# Patient Record
Sex: Female | Born: 1960 | Race: White | Hispanic: No | Smoking: Former smoker
Health system: Southern US, Community
[De-identification: ages and names within clinical notes are randomized; demographics above are authoritative.]

## PROBLEM LIST (undated history)

## (undated) ENCOUNTER — Emergency Department (HOSPITAL_COMMUNITY): Discharge status: Absent without leave | Payer: 59

## (undated) DIAGNOSIS — I10 Essential (primary) hypertension: Secondary | ICD-10-CM

## (undated) DIAGNOSIS — E785 Hyperlipidemia, unspecified: Secondary | ICD-10-CM

## (undated) DIAGNOSIS — M1990 Unspecified osteoarthritis, unspecified site: Secondary | ICD-10-CM

## (undated) DIAGNOSIS — K219 Gastro-esophageal reflux disease without esophagitis: Secondary | ICD-10-CM

## (undated) DIAGNOSIS — I700 Atherosclerosis of aorta: Secondary | ICD-10-CM

## (undated) DIAGNOSIS — F419 Anxiety disorder, unspecified: Secondary | ICD-10-CM

## (undated) DIAGNOSIS — F329 Major depressive disorder, single episode, unspecified: Secondary | ICD-10-CM

## (undated) DIAGNOSIS — F32A Depression, unspecified: Secondary | ICD-10-CM

## (undated) HISTORY — DX: Unspecified osteoarthritis, unspecified site: M19.90

## (undated) HISTORY — PX: TUBAL LIGATION: SHX77

## (undated) HISTORY — PX: ABDOMINAL HYSTERECTOMY: SHX81

## (undated) HISTORY — DX: Essential (primary) hypertension: I10

## (undated) HISTORY — DX: Hyperlipidemia, unspecified: E78.5

## (undated) HISTORY — DX: Major depressive disorder, single episode, unspecified: F32.9

## (undated) HISTORY — DX: Depression, unspecified: F32.A

## (undated) HISTORY — DX: Anxiety disorder, unspecified: F41.9

## (undated) HISTORY — DX: Gastro-esophageal reflux disease without esophagitis: K21.9

## (undated) HISTORY — DX: Atherosclerosis of aorta: I70.0

---

## 2007-06-15 ENCOUNTER — Emergency Department: Payer: Self-pay | Admitting: Emergency Medicine

## 2012-02-08 ENCOUNTER — Ambulatory Visit: Payer: Self-pay | Admitting: Internal Medicine

## 2012-05-16 DIAGNOSIS — Z8619 Personal history of other infectious and parasitic diseases: Secondary | ICD-10-CM | POA: Insufficient documentation

## 2012-05-16 DIAGNOSIS — I10 Essential (primary) hypertension: Secondary | ICD-10-CM | POA: Insufficient documentation

## 2012-05-16 HISTORY — DX: Essential (primary) hypertension: I10

## 2012-11-30 DIAGNOSIS — E782 Mixed hyperlipidemia: Secondary | ICD-10-CM | POA: Insufficient documentation

## 2013-01-05 ENCOUNTER — Ambulatory Visit: Payer: Self-pay | Admitting: Gastroenterology

## 2013-05-01 ENCOUNTER — Ambulatory Visit: Payer: Self-pay | Admitting: Family Medicine

## 2013-05-19 DIAGNOSIS — D1803 Hemangioma of intra-abdominal structures: Secondary | ICD-10-CM | POA: Insufficient documentation

## 2013-06-01 DIAGNOSIS — K580 Irritable bowel syndrome with diarrhea: Secondary | ICD-10-CM | POA: Insufficient documentation

## 2013-06-01 DIAGNOSIS — H9193 Unspecified hearing loss, bilateral: Secondary | ICD-10-CM | POA: Insufficient documentation

## 2014-07-27 ENCOUNTER — Other Ambulatory Visit: Payer: Self-pay

## 2014-07-27 DIAGNOSIS — F329 Major depressive disorder, single episode, unspecified: Secondary | ICD-10-CM

## 2014-07-27 DIAGNOSIS — J309 Allergic rhinitis, unspecified: Secondary | ICD-10-CM

## 2014-07-27 DIAGNOSIS — R0982 Postnasal drip: Principal | ICD-10-CM

## 2014-07-27 DIAGNOSIS — F32A Depression, unspecified: Secondary | ICD-10-CM

## 2014-07-27 DIAGNOSIS — K219 Gastro-esophageal reflux disease without esophagitis: Secondary | ICD-10-CM

## 2014-07-27 NOTE — Telephone Encounter (Signed)
Received a fax from [facility] requesting a refill of Flonase, Omeprazole 20mg  & Wellbutrin 75mg 

## 2014-07-28 MED ORDER — FLUTICASONE PROPIONATE 50 MCG/ACT NA SUSP: 2.0000 | Freq: Every day | NASAL | Status: DC

## 2014-07-28 MED ORDER — OMEPRAZOLE 20 MG PO CPDR: 20.0000 mg | DELAYED_RELEASE_CAPSULE | Freq: Every day | ORAL | Status: DC

## 2014-07-28 MED ORDER — BUPROPION HCL 75 MG PO TABS: 75.0000 mg | ORAL_TABLET | Freq: Two times a day (BID) | ORAL | Status: DC

## 2014-07-30 ENCOUNTER — Ambulatory Visit (INDEPENDENT_AMBULATORY_CARE_PROVIDER_SITE_OTHER): Payer: 59 | Admitting: Family Medicine

## 2014-07-30 ENCOUNTER — Telehealth: Payer: Self-pay | Admitting: Family Medicine

## 2014-07-30 ENCOUNTER — Encounter: Payer: Self-pay | Admitting: Family Medicine

## 2014-07-30 VITALS — BP 138/86 | HR 94 | Temp 98.7°F | Resp 16 | Ht 63.0 in | Wt 143.4 lb

## 2014-07-30 DIAGNOSIS — E782 Mixed hyperlipidemia: Secondary | ICD-10-CM | POA: Insufficient documentation

## 2014-07-30 DIAGNOSIS — F411 Generalized anxiety disorder: Secondary | ICD-10-CM | POA: Insufficient documentation

## 2014-07-30 DIAGNOSIS — I10 Essential (primary) hypertension: Secondary | ICD-10-CM | POA: Diagnosis not present

## 2014-07-30 DIAGNOSIS — D1803 Hemangioma of intra-abdominal structures: Secondary | ICD-10-CM

## 2014-07-30 DIAGNOSIS — A6000 Herpesviral infection of urogenital system, unspecified: Secondary | ICD-10-CM | POA: Insufficient documentation

## 2014-07-30 DIAGNOSIS — N329 Bladder disorder, unspecified: Secondary | ICD-10-CM

## 2014-07-30 DIAGNOSIS — I2510 Atherosclerotic heart disease of native coronary artery without angina pectoris: Secondary | ICD-10-CM | POA: Insufficient documentation

## 2014-07-30 DIAGNOSIS — Z79890 Hormone replacement therapy (postmenopausal): Secondary | ICD-10-CM

## 2014-07-30 DIAGNOSIS — J309 Allergic rhinitis, unspecified: Secondary | ICD-10-CM | POA: Insufficient documentation

## 2014-07-30 DIAGNOSIS — N951 Menopausal and female climacteric states: Secondary | ICD-10-CM | POA: Insufficient documentation

## 2014-07-30 DIAGNOSIS — Z8619 Personal history of other infectious and parasitic diseases: Secondary | ICD-10-CM | POA: Insufficient documentation

## 2014-07-30 DIAGNOSIS — F32A Depression, unspecified: Secondary | ICD-10-CM | POA: Insufficient documentation

## 2014-07-30 DIAGNOSIS — F329 Major depressive disorder, single episode, unspecified: Secondary | ICD-10-CM | POA: Insufficient documentation

## 2014-07-30 MED ORDER — HYDROCHLOROTHIAZIDE 12.5 MG PO TABS: 12.5000 mg | ORAL_TABLET | Freq: Every day | ORAL | Status: DC

## 2014-07-30 MED ORDER — DIAZEPAM 5 MG PO TABS: 5.0000 mg | ORAL_TABLET | Freq: Two times a day (BID) | ORAL | Status: DC | PRN

## 2014-07-30 NOTE — Telephone Encounter (Signed)
On abstracting patient's chart there is evidence of CT scan done 05/01/13 shows liver hemangioma, f/u MRI with and without contrast in 6 months was recommended. Does she recall ever having this MRI done? If not may I order it now?

## 2014-07-30 NOTE — Progress Notes (Signed)
Name: Olivia Frazier   MRN: [id number]    DOB: 11/03/1960   Date:07/30/2014       Progress Note  Subjective  Chief Complaint  Chief Complaint  Patient presents with  . Hypertension    patient presents with elevated blood pressure for a couple of weeks. Patient thinks it is anxiety related.    HPI  Patient is here for routine follow up of Hypertension. First diagnosed with hypertension several years ago. Current anti-hypertension medication regimen includes dietary modification, weight management and ACE inhibiter Prinvil 20mg  one a day. Patient is following physician recommended management. Checking blood pressure outside of physician office. Results average systolic 790-240 and average diastolic 97-35. Associated symptoms do not include headache, dizziness, nausea, lower extremity swelling, shortness of breath, chest pain, numbness.  Anxiety: Patient complains of anxiety disorder.  She has the following symptoms: difficulty concentrating, fatigue, irritable, racing thoughts. Onset of symptoms was approximately several years ago, gradually worsening since that time. She denies current suicidal and homicidal ideation. Family history significant for anxiety and depression.Possible organic causes contributing are: none. Risk factors: previous episode of depression Previous treatment includes benzodiazepines valium and Wellbutrin and none.  She complains of the following side effects from the treatment: decreased libido when on several SSRIs. Currently on Wellbutrin 75mg  po bid but only taking it once a day. Using valium sparingly.    Past Medical History  Diagnosis Date  . Anxiety   . Arthritis   . Depression   . GERD (gastroesophageal reflux disease)   . Hyperlipidemia   . Hypertension     Past Surgical History  Procedure Laterality Date  . Abdominal hysterectomy    . Tubal ligation      Family History  Problem Relation Age of Onset  . Arthritis Mother   . Cancer Mother      melanoma on her leg  . Hearing loss Mother   . Heart disease Mother     leaky valve  . Hypertension Mother     history of  . Kidney disease Mother   . Vision loss Mother   . Thrombosis Mother     history of in right leg  . Dementia Mother   . Diabetes Father   . Stroke Father   . Hypertension Father   . Cancer Maternal Aunt     breast  . Diverticulitis Maternal Grandmother   . Stroke Maternal Grandmother     had a brain tumor  . Early death Maternal Grandfather   . Stroke Maternal Grandfather     History   Social History  . Marital Status: Divorced    Spouse Name: N/A  . Number of Children: 2  . Years of Education: N/A   Occupational History  . Not on file.   Social History Main Topics  . Smoking status: Former Research scientist (life sciences)  . Smokeless tobacco: Not on file  . Alcohol Use: No  . Drug Use: No  . Sexual Activity:    Partners: Male   Other Topics Concern  . Not on file   Social History Narrative  . No narrative on file     Current outpatient prescriptions:  .  Chromium 500 MCG TABS, Take 1 tablet by mouth daily., Disp: , Rfl:  .  estradiol (ESTRACE) 1 MG tablet, Take 1 tablet by mouth daily., Disp: , Rfl:  .  Multiple Vitamins tablet, Take 1 tablet by mouth daily., Disp: , Rfl:  .  Omega-3 Fatty Acids (SALMON OIL-1000) 200 MG CAPS,  Take 1 capsule by mouth daily., Disp: , Rfl:  .  pravastatin (PRAVACHOL) 80 MG tablet, Take 1 tablet by mouth at bedtime., Disp: , Rfl:  .  valACYclovir (VALTREX) 500 MG tablet, Take 1 tablet by mouth daily., Disp: , Rfl:  .  Ascorbic Acid (VITAMIN C) 1000 MG tablet, Take 1 tablet by mouth daily., Disp: , Rfl:  .  buPROPion (WELLBUTRIN) 75 MG tablet, Take 1 tablet (75 mg total) by mouth 2 (two) times daily., Disp: 60 tablet, Rfl: 5 .  Calcium Carbonate-Vitamin D 600-400 MG-UNIT per tablet, Take 1 tablet by mouth daily., Disp: , Rfl:  .  diazepam (VALIUM) 5 MG tablet, Take 1 tablet (5 mg total) by mouth 2 (two) times daily as needed for  anxiety., Disp: 60 tablet, Rfl: 1 .  fluticasone (FLONASE) 50 MCG/ACT nasal spray, Place 2 sprays into both nostrils daily., Disp: 16 g, Rfl: 5 .  Garlic 5366 MG CAPS, Take 1 capsule by mouth daily., Disp: , Rfl:  .  hydrochlorothiazide (HYDRODIURIL) 12.5 MG tablet, Take 1 tablet (12.5 mg total) by mouth daily., Disp: 30 tablet, Rfl: 3 .  loratadine (CLARITIN) 10 MG tablet, Take 1 tablet by mouth daily., Disp: , Rfl:  .  omeprazole (PRILOSEC) 20 MG capsule, Take 1 capsule (20 mg total) by mouth daily., Disp: 30 capsule, Rfl: 5 .  PRINIVIL 20 MG tablet, Take 1 tablet by mouth daily., Disp: , Rfl:   Allergies  Allergen Reactions  . Escitalopram Oxalate     Other reaction(s): Dizziness  . Omega-3-Acid Ethyl Esters Other (See Comments)    Hematuria, UTI  . Paroxetine Hcl     Other reaction(s): Headache, Other (See Comments) Rapid pulse  . Sertraline Other (See Comments)    Other reaction(s): Other (See Comments) Rapid pulse     ROS  CONSTITUTIONAL: No significant weight changes, fever, chills, weakness or fatigue.  HEENT:  - Eyes: No visual changes.  - Ears: No auditory changes. No pain.  - Nose: No sneezing, congestion, runny nose. - Throat: No sore throat. No changes in swallowing. SKIN: No rash or itching.  CARDIOVASCULAR: No chest pain, chest pressure or chest discomfort. No palpitations or edema.  RESPIRATORY: No shortness of breath, cough or sputum.  GASTROINTESTINAL: No anorexia, nausea, vomiting. No changes in bowel habits. No abdominal pain or blood.  GENITOURINARY: No dysuria. No frequency. No discharge.  NEUROLOGICAL: No headache, dizziness, syncope, paralysis, ataxia, numbness or tingling in the extremities. No memory changes. No change in bowel or bladder control.  MUSCULOSKELETAL: No joint pain. No muscle pain. HEMATOLOGIC: No anemia, bleeding or bruising.  LYMPHATICS: No enlarged lymph nodes.  PSYCHIATRIC: No change in mood. No change in sleep pattern.   ENDOCRINOLOGIC: No reports of sweating, cold or heat intolerance. No polyuria or polydipsia.   Objective  Filed Vitals:   07/30/14 1445  BP: 138/86  Pulse: 94  Temp: 98.7 F (37.1 C)  TempSrc: Oral  Resp: 16  Height: 5\' 3"  (1.6 m)  Weight: 143 lb 6.4 oz (65.046 kg)  SpO2: 96%    Physical Exam  Constitutional: Patient appears well-developed and well-nourished. In no distress.  HEENT:  - Head: Normocephalic and atraumatic.  - Ears: Bilateral TMs gray, no erythema or effusion - Nose: Nasal mucosa moist - Mouth/Throat: Oropharynx is clear and moist. No tonsillar hypertrophy or erythema. No post nasal drainage.  - Eyes: Conjunctivae clear, EOM movements normal. PERRLA. No scleral icterus.  Neck: Normal range of motion. Neck supple. No JVD  present. No thyromegaly present.  Cardiovascular: Normal rate, regular rhythm and normal heart sounds.  No murmur heard.  Pulmonary/Chest: Effort normal and breath sounds normal. No respiratory distress. Musculoskeletal: Normal range of motion bilateral UE and LE, no joint effusions. Peripheral vascular: Bilateral LE no edema. Neurological: CN II-XII grossly intact with no focal deficits. Alert and oriented to person, place, and time. Coordination, balance, strength, speech and gait are normal.  Skin: Skin is warm and dry. No rash noted. No erythema.  Psychiatric: Patient has a normal mood and affect. Behavior is normal in office today. Judgment and thought content normal in office today.   Assessment & Plan  1. Hypertension goal BP (blood pressure) < 150/90 Continue checking BP at home. Continue Prinvil 20mg  one a day. Add on HCTZ as needed.  - hydrochlorothiazide (HYDRODIURIL) 12.5 MG tablet; Take 1 tablet (12.5 mg total) by mouth daily.  Dispense: 30 tablet; Refill: 3  2. GAD (generalized anxiety disorder) Encouraged patient to take her Bupropion twice a day as prescribed instead of once a day. Valium refilled.

## 2014-08-02 NOTE — Telephone Encounter (Signed)
Tried to contact patient to find out if MRI was performed, but there was no answer. A message was left for the patient to give Olivia Frazier a call back when she got the chance.

## 2014-08-03 ENCOUNTER — Other Ambulatory Visit: Payer: Self-pay | Admitting: Family Medicine

## 2014-08-03 DIAGNOSIS — I10 Essential (primary) hypertension: Secondary | ICD-10-CM

## 2014-08-03 NOTE — Telephone Encounter (Signed)
Patient returned my call and stated that she had a bladder tack back in 2001 by Dr. Davis Gourd, but then she had to have it redone since some of the staples moved. Patient stated that not all staples were removed due to it being unsafe. Patient stated that she did not mind having the MRI done but she wanted to make sure that the staples would not come out. She asked if she could have an xray first then the MRI.  Contacted the MRI dept, and spoke to [place]. She stated they scan people all of the time with bladder tacks so she will be fine. Patient and Dr. Nadine Counts was informed.  Patient needs an appt anytime after 1pm on Friday when she gets off of work.

## 2014-08-03 NOTE — Telephone Encounter (Signed)
Please let patient know: MRI liver with and without contrast ordered for later in July, someone should call her within the next 2 weeks to schedule that. X-ray of pelvis ordered to be done at outpatient imaging center [place] at her convenience. Also she will need to have a recent CMP blood work on file for the contrast with her MRI. Lab work ordered and printed out for patient to get done.

## 2014-08-03 NOTE — Telephone Encounter (Signed)
Please let patient know: MRI liver with and without contrast ordered for later in July. X-ray of pelvis ordered to be done at outpatient imaging center [place]. Also she will need to have a recent CMP blood work on file for the contrast with her MRI. Lab work ordered and printed out for patient to get done.

## 2014-08-17 ENCOUNTER — Encounter: Payer: Self-pay | Admitting: Family Medicine

## 2014-08-20 ENCOUNTER — Ambulatory Visit: Payer: 59

## 2014-08-30 ENCOUNTER — Other Ambulatory Visit: Payer: Self-pay | Admitting: Family Medicine

## 2014-08-30 DIAGNOSIS — E782 Mixed hyperlipidemia: Secondary | ICD-10-CM

## 2014-08-30 MED ORDER — GEMFIBROZIL 600 MG PO TABS: 600.0000 mg | ORAL_TABLET | Freq: Two times a day (BID) | ORAL | Status: DC

## 2014-08-30 NOTE — Telephone Encounter (Signed)
Refill request was sent to Dr. Ashany Sundaram for approval and submission.  

## 2014-08-30 NOTE — Telephone Encounter (Signed)
Pt states she would like to renew the RX Gemfibrozil 600 mg twice a day. She has a few left but they have expired.[facility] 90 day supply.

## 2014-09-01 ENCOUNTER — Other Ambulatory Visit: Payer: Self-pay | Admitting: Family Medicine

## 2014-09-01 DIAGNOSIS — K580 Irritable bowel syndrome with diarrhea: Secondary | ICD-10-CM

## 2014-09-01 MED ORDER — LOPERAMIDE HCL 2 MG PO TABS: 2.0000 mg | ORAL_TABLET | Freq: Four times a day (QID) | ORAL | Status: DC | PRN

## 2014-09-01 NOTE — Telephone Encounter (Signed)
Refill request was sent to Dr. Bobetta Lime for approval and submission.  Imodium A-D 2MG , 1 (one) Tablet Tablet start 4mg  po x1 then 2mg  po after each loose stool until maint. dose established, max 16mg /day, #60, 30 days starting 01/15/2014, Ref. x1. Active. Associated Diagnosis:Irritable bowel syndrome with diarrhea (564.1  K58.0)  Recorded 03/05/2014 02:19 PM by Lolita Rieger, Office Visit.

## 2014-09-01 NOTE — Telephone Encounter (Signed)
Requesting refill on Loperamide 2mg  as needed and Gemfibrozil 600mg  2x daily. Please send [facility] fax [phone] (p) [phone] ext [phone]

## 2014-09-03 ENCOUNTER — Encounter (INDEPENDENT_AMBULATORY_CARE_PROVIDER_SITE_OTHER): Payer: Self-pay

## 2014-09-24 ENCOUNTER — Ambulatory Visit (INDEPENDENT_AMBULATORY_CARE_PROVIDER_SITE_OTHER): Payer: 59 | Admitting: Family Medicine

## 2014-09-24 ENCOUNTER — Encounter: Payer: Self-pay | Admitting: Family Medicine

## 2014-09-24 ENCOUNTER — Ambulatory Visit
Admission: RE | Admit: 2014-09-24 | Discharge: 2014-09-24 | Disposition: A | Discharge status: Home / Self Care | Payer: 59 | Source: Ambulatory Visit | Attending: Family Medicine | Admitting: Family Medicine

## 2014-09-24 VITALS — BP 128/74 | HR 97 | Temp 98.4°F | Resp 16 | Ht 63.0 in | Wt 145.7 lb

## 2014-09-24 DIAGNOSIS — R102 Pelvic and perineal pain unspecified side: Secondary | ICD-10-CM

## 2014-09-24 DIAGNOSIS — R1084 Generalized abdominal pain: Secondary | ICD-10-CM

## 2014-09-24 DIAGNOSIS — R071 Chest pain on breathing: Secondary | ICD-10-CM | POA: Diagnosis not present

## 2014-09-24 DIAGNOSIS — N329 Bladder disorder, unspecified: Secondary | ICD-10-CM

## 2014-09-24 NOTE — Progress Notes (Signed)
Name: Olivia Frazier   MRN: [id number]    DOB: Sep 19, 1960   Date:09/24/2014       Progress Note  Subjective  Chief Complaint  Chief Complaint  Patient presents with  . Advice Only    patient wants to talk about the MRI and she needs an x-ray.    HPI  Olivia Frazier is a pleasant 54 year old female who is here today to discuss getting some X-rays done prior to her proceeding forward with MRI of her abdomen to surveillance a previously found Liver Hemangioma.  She is experiencing some lower midline chest wall pain with deep breathing, abdominal bloating, distention, discomfort, pelvic discomfort. [name] is concerned that her previous multiple surgeries in her pelvic area has surgical stables or clips that may cause harm during an MRI. Denies fevers, chills, coughing, nausea, vomiting, change in bowel habits, palpitations, vaginal bleeding. Occasionally does get numbness now her left arm. She has started back on her cholesterol medication.  Patient Active Problem List   Diagnosis Date Noted  . Allergic rhinitis 07/30/2014  . Menopausal syndrome on hormone replacement therapy 07/30/2014  . GAD (generalized anxiety disorder) 07/30/2014  . Combined fat and carbohydrate induced hyperlipemia 07/30/2014  . Coronary atherosclerosis of native coronary artery 07/30/2014  . History of Salmonella gastroenteritis 07/30/2014  . Genital herpes simplex type 2 07/30/2014  . Depression 07/30/2014  . Bilateral change in hearing 06/01/2013  . Irritable bowel syndrome with diarrhea 06/01/2013  . Hemangioma of liver 05/19/2013  . Hypercholesterolemia with hypertriglyceridemia 11/30/2012  . Hypertension goal BP (blood pressure) < 150/90 05/16/2012    Social History  Substance Use Topics  . Smoking status: Former Research scientist (life sciences)  . Smokeless tobacco: Not on file  . Alcohol Use: No     Current outpatient prescriptions:  .  Ascorbic Acid (VITAMIN C) 1000 MG tablet, Take 1 tablet by mouth daily., Disp: , Rfl:   .  buPROPion (WELLBUTRIN) 75 MG tablet, Take 1 tablet (75 mg total) by mouth 2 (two) times daily., Disp: 60 tablet, Rfl: 5 .  Calcium Carbonate-Vitamin D 600-400 MG-UNIT per tablet, Take 1 tablet by mouth daily., Disp: , Rfl:  .  Chromium 500 MCG TABS, Take 1 tablet by mouth daily., Disp: , Rfl:  .  diazepam (VALIUM) 5 MG tablet, Take 1 tablet (5 mg total) by mouth 2 (two) times daily as needed for anxiety., Disp: 60 tablet, Rfl: 1 .  estradiol (ESTRACE) 1 MG tablet, Take 1 tablet by mouth daily., Disp: , Rfl:  .  fluticasone (FLONASE) 50 MCG/ACT nasal spray, Place 2 sprays into both nostrils daily., Disp: 16 g, Rfl: 5 .  Garlic 0865 MG CAPS, Take 1 capsule by mouth daily., Disp: , Rfl:  .  gemfibrozil (LOPID) 600 MG tablet, Take 1 tablet (600 mg total) by mouth 2 (two) times daily before a meal., Disp: 180 tablet, Rfl: 3 .  hydrochlorothiazide (HYDRODIURIL) 12.5 MG tablet, Take 1 tablet (12.5 mg total) by mouth daily., Disp: 30 tablet, Rfl: 3 .  loperamide (IMODIUM A-D) 2 MG tablet, Take 1 tablet (2 mg total) by mouth 4 (four) times daily as needed for diarrhea or loose stools., Disp: 100 tablet, Rfl: 3 .  loratadine (CLARITIN) 10 MG tablet, Take 1 tablet by mouth daily., Disp: , Rfl:  .  Multiple Vitamins tablet, Take 1 tablet by mouth daily., Disp: , Rfl:  .  Omega-3 Fatty Acids (SALMON OIL-1000) 200 MG CAPS, Take 1 capsule by mouth daily., Disp: , Rfl:  .  omeprazole (PRILOSEC) 20 MG capsule, Take 1 capsule (20 mg total) by mouth daily., Disp: 30 capsule, Rfl: 5 .  pravastatin (PRAVACHOL) 80 MG tablet, Take 1 tablet by mouth at bedtime., Disp: , Rfl:  .  PRINIVIL 20 MG tablet, Take 1 tablet by mouth  daily, Disp: 90 tablet, Rfl: 2 .  valACYclovir (VALTREX) 500 MG tablet, Take 1 tablet by mouth daily., Disp: , Rfl:   Past Surgical History  Procedure Laterality Date  . Abdominal hysterectomy    . Tubal ligation      Family History  Problem Relation Age of Onset  . Arthritis Mother   .  Cancer Mother     melanoma on her leg  . Hearing loss Mother   . Heart disease Mother     leaky valve  . Hypertension Mother     history of  . Kidney disease Mother   . Vision loss Mother   . Thrombosis Mother     history of in right leg  . Dementia Mother   . Diabetes Father   . Stroke Father   . Hypertension Father   . Cancer Maternal Aunt     breast  . Diverticulitis Maternal Grandmother   . Stroke Maternal Grandmother     had a brain tumor  . Early death Maternal Grandfather   . Stroke Maternal Grandfather     Allergies  Allergen Reactions  . Escitalopram Oxalate     Other reaction(s): Dizziness  . Omega-3-Acid Ethyl Esters Other (See Comments)    Hematuria, UTI  . Paroxetine Hcl     Other reaction(s): Headache, Other (See Comments) Rapid pulse  . Sertraline Other (See Comments)    Other reaction(s): Other (See Comments) Rapid pulse     Review of Systems  CONSTITUTIONAL: No significant weight changes, fever, chills, weakness or fatigue.  HEENT:  - Eyes: No visual changes.  - Ears: No auditory changes. No pain.  - Nose: No sneezing, congestion, runny nose. - Throat: No sore throat. No changes in swallowing. SKIN: No rash or itching.  CARDIOVASCULAR: No chest pain, chest pressure or chest discomfort. No palpitations or edema.  RESPIRATORY: No shortness of breath, cough or sputum. Yes lower chest wall pain with breathing deeply in. GASTROINTESTINAL: No anorexia, nausea, vomiting. No changes in bowel habits. Yes abdominal discomfort generalized with bloating. GENITOURINARY: No dysuria. No frequency. No discharge. Yes pelvic pain. NEUROLOGICAL: No headache, dizziness, syncope, paralysis, ataxia, numbness or tingling in the extremities. No memory changes. No change in bowel or bladder control.  MUSCULOSKELETAL: No joint pain. No muscle pain. HEMATOLOGIC: No anemia, bleeding or bruising.  LYMPHATICS: No enlarged lymph nodes.  PSYCHIATRIC: No change in mood. No  change in sleep pattern.  ENDOCRINOLOGIC: No reports of sweating, cold or heat intolerance. No polyuria or polydipsia.     Objective  BP 128/74 mmHg  Pulse 97  Temp(Src) 98.4 F (36.9 C) (Oral)  Resp 16  Ht 5\' 3"  (1.6 m)  Wt 145 lb 11.2 oz (66.089 kg)  BMI 25.82 kg/m2  SpO2 97% Body mass index is 25.82 kg/(m^2).  Physical Exam  Constitutional: Patient appears well-developed and well-nourished. In no distress.  Neck: Normal range of motion. Neck supple. No JVD present. No thyromegaly present.  Cardiovascular: Normal rate, regular rhythm and normal heart sounds.  No murmur heard.  Pulmonary/Chest: Effort normal and breath sounds normal. No respiratory distress. Abdomen: Soft, non tender, mildly distended, normal bowel sounds, no HSM. Musculoskeletal: Normal range of motion bilateral UE  and LE, no joint effusions. Peripheral vascular: Bilateral LE no edema. Neurological: CN II-XII grossly intact with no focal deficits. Alert and oriented to person, place, and time. Coordination, balance, strength, speech and gait are normal.  Skin: Skin is warm and dry. No rash noted. No erythema.  Psychiatric: Patient has a normal mood and affect. Behavior is normal in office today. Judgment and thought content normal in office today.   Assessment & Plan  1. Generalized abdominal pain Will assess with X-ray, clinically stable findings. Reassurance provided.  - DG Abd 2 Views; Future  2. Pain of anterior chest wall with respiration Will assess with X-ray, clinically stable findings. Reassurance provided.  - DG Chest 2 View; Future  3. Pelvic pain in female Will assess with X-ray, clinically stable findings. Reassurance provided.  - DG Pelvis Comp Min 3V; Future

## 2014-09-25 ENCOUNTER — Telehealth: Payer: Self-pay | Admitting: Family Medicine

## 2014-09-25 NOTE — Telephone Encounter (Signed)
Please do the following steps:  1) Step one: call radiology department and ensure that patient's previous surgical coils in her pelvic region (SEE X-RAY PELVIS results) will not be a contraindication for her to proceed with MRI ordered to monitor her liver hemangioma.  If radiology says this is okay then go ahead and schedule her MRI for a Friday AFTERNOON ONLY.   2) Step two: call patient and let her know that the results of her chest, abdomen, pelvic x-rays were normal and showed the surgical coils in her pelvis and communicate MRI date/time if it was safe to proceed with this study.

## 2014-09-27 ENCOUNTER — Telehealth: Payer: Self-pay | Admitting: Family Medicine

## 2014-09-27 ENCOUNTER — Other Ambulatory Visit: Payer: Self-pay | Admitting: Family Medicine

## 2014-09-27 ENCOUNTER — Telehealth: Payer: Self-pay

## 2014-09-27 DIAGNOSIS — E782 Mixed hyperlipidemia: Secondary | ICD-10-CM

## 2014-09-27 NOTE — Telephone Encounter (Signed)
Pt is calling to get clarity on her recent x-ray results before she has her MRI

## 2014-09-27 NOTE — Telephone Encounter (Signed)
Called Centralized scheduling, spoke with Vivien Rota to get this patient scheduled for a MRI of the Liver with and without contrast. Patient was given an appt for this Friday (10/01/14) at 3:45pm. Patient is to arrive at 3:15pm and is to only have a light lunch no later than 12 noon.   Patient was informed and then asked if we could fax her the results to [phone].  The requested information was printed and faxed to the number listed, patient's MRI appt and time was written on it and it was faxed.  Confirmation was received.

## 2014-09-27 NOTE — Telephone Encounter (Signed)
Ordered

## 2014-09-27 NOTE — Telephone Encounter (Signed)
Contacted this patient to inform he that she should get a call from the schedulers clarifying that it is ok to proceed with the MRI on Friday after they speak with the radiologist, but she informed me that someone had just called and told her it was ok to have it performed.  Patient stated that she wants to have an order for a Hepatic Panel to make sure her liver was ok. She stated it could be faxed to her office at [phone]

## 2014-09-28 ENCOUNTER — Telehealth: Payer: Self-pay | Admitting: Family Medicine

## 2014-09-28 LAB — HEPATIC FUNCTION PANEL
ALK PHOS: 56 IU/L (ref 39–117)
ALT: 13 IU/L (ref 0–32)
AST: 18 IU/L (ref 0–40)
Albumin: 4.5 g/dL (ref 3.5–5.5)
BILIRUBIN, DIRECT: 0.07 mg/dL (ref 0.00–0.40)
TOTAL PROTEIN: 6.5 g/dL (ref 6.0–8.5)

## 2014-09-28 NOTE — Telephone Encounter (Signed)
Her insurance will not cover for additional MRIs as there is no medical need for anything other than to repeat looking at her liver. Her Abdominal and Chest X-rays were normal.

## 2014-09-28 NOTE — Telephone Encounter (Signed)
Contacted the patient regarding the message below and encouraged her to wait until we get those results to see if any additional testing was needed.

## 2014-09-28 NOTE — Telephone Encounter (Signed)
Pt states she was here on 09/24/14 and a MRI was ordered for the abdomen (liver) and pt states the upper abdomen and the heart is supposed to be added. She called the scheduling for the MRI and she was told the only thing that was ordered was the liver. Pt would like to get all done at one appt.

## 2014-09-28 NOTE — Telephone Encounter (Signed)
ERRONEOUS ENTRY.

## 2014-09-29 ENCOUNTER — Telehealth: Payer: Self-pay

## 2014-09-29 NOTE — Telephone Encounter (Signed)
Notified Helene Kelp in Pharmacist, community at [facility] that I have received prior authorization for MRI abdomen with and without contrast from [facility] for ICD-10: D18.03 Auth# [id number] Valid 09-29-14 Expires 11-13-14

## 2014-10-01 ENCOUNTER — Ambulatory Visit
Admission: RE | Admit: 2014-10-01 | Discharge: 2014-10-01 | Disposition: A | Discharge status: Home / Self Care | Payer: 59 | Source: Ambulatory Visit | Attending: Family Medicine | Admitting: Family Medicine

## 2014-10-01 DIAGNOSIS — K769 Liver disease, unspecified: Secondary | ICD-10-CM | POA: Diagnosis not present

## 2014-10-01 DIAGNOSIS — Z09 Encounter for follow-up examination after completed treatment for conditions other than malignant neoplasm: Secondary | ICD-10-CM | POA: Diagnosis present

## 2014-10-01 DIAGNOSIS — D1803 Hemangioma of intra-abdominal structures: Secondary | ICD-10-CM

## 2014-10-01 MED ORDER — GADOBENATE DIMEGLUMINE 529 MG/ML IV SOLN
20.0000 mL | Freq: Once | INTRAVENOUS | Status: AC | PRN
  Administered 2014-10-01: 13 mL via INTRAVENOUS

## 2014-10-04 ENCOUNTER — Encounter: Payer: Self-pay | Admitting: Family Medicine

## 2014-10-07 ENCOUNTER — Telehealth: Payer: Self-pay | Admitting: Family Medicine

## 2014-10-07 ENCOUNTER — Other Ambulatory Visit: Payer: Self-pay | Admitting: Family Medicine

## 2014-10-07 DIAGNOSIS — E782 Mixed hyperlipidemia: Secondary | ICD-10-CM

## 2014-10-07 MED ORDER — ICOSAPENT ETHYL 1 G PO CAPS: 2.0000 g | ORAL_CAPSULE | Freq: Two times a day (BID) | ORAL | Status: DC

## 2014-10-07 NOTE — Telephone Encounter (Signed)
PT SAID THAT SHE CAN NOT TAKE GEMFIBROZIF. IT MAKES HER STOMACH REAL  SWOLLEN. SHE HAS TAKEN IT 2 DIFFERENT TIMES.  NEEDS SOMETHING ELSE IN IT'S PLACE.

## 2014-10-07 NOTE — Telephone Encounter (Signed)
Patient was informed and said thanks. 

## 2014-10-07 NOTE — Telephone Encounter (Signed)
Stop Gemfibrozil. I have sent to her local [facility] pharmacy Vascepa generic 1 gram capsules, take 2 twice a day, quantity #40 for a 10 day trial to replace the Gemfibrozil. If she can tolerate this new medication ask her to call and let me know to send it to her mail order pharmacy.

## 2014-11-01 DIAGNOSIS — H903 Sensorineural hearing loss, bilateral: Secondary | ICD-10-CM | POA: Insufficient documentation

## 2014-12-02 ENCOUNTER — Other Ambulatory Visit: Payer: Self-pay | Admitting: Family Medicine

## 2015-01-04 ENCOUNTER — Other Ambulatory Visit: Payer: Self-pay | Admitting: Family Medicine

## 2015-01-07 ENCOUNTER — Ambulatory Visit: Payer: 59 | Admitting: Family Medicine

## 2015-01-27 ENCOUNTER — Encounter: Payer: Self-pay | Admitting: Family Medicine

## 2015-02-03 ENCOUNTER — Other Ambulatory Visit: Payer: Self-pay | Admitting: Family Medicine

## 2015-02-04 ENCOUNTER — Encounter: Payer: Self-pay | Admitting: Family Medicine

## 2015-02-04 ENCOUNTER — Ambulatory Visit (INDEPENDENT_AMBULATORY_CARE_PROVIDER_SITE_OTHER): Payer: 59 | Admitting: Family Medicine

## 2015-02-04 VITALS — BP 118/76 | HR 86 | Temp 98.1°F | Resp 12 | Wt 142.9 lb

## 2015-02-04 DIAGNOSIS — K219 Gastro-esophageal reflux disease without esophagitis: Secondary | ICD-10-CM | POA: Diagnosis not present

## 2015-02-04 DIAGNOSIS — F411 Generalized anxiety disorder: Secondary | ICD-10-CM

## 2015-02-04 DIAGNOSIS — Z79890 Hormone replacement therapy (postmenopausal): Secondary | ICD-10-CM | POA: Diagnosis not present

## 2015-02-04 DIAGNOSIS — E782 Mixed hyperlipidemia: Secondary | ICD-10-CM

## 2015-02-04 DIAGNOSIS — N951 Menopausal and female climacteric states: Secondary | ICD-10-CM | POA: Diagnosis not present

## 2015-02-04 DIAGNOSIS — I10 Essential (primary) hypertension: Secondary | ICD-10-CM

## 2015-02-04 DIAGNOSIS — K580 Irritable bowel syndrome with diarrhea: Secondary | ICD-10-CM | POA: Diagnosis not present

## 2015-02-04 DIAGNOSIS — F419 Anxiety disorder, unspecified: Secondary | ICD-10-CM | POA: Insufficient documentation

## 2015-02-04 MED ORDER — CHROMIUM 500 MCG PO TABS: 1.0000 | ORAL_TABLET | Freq: Every day | ORAL | Status: AC

## 2015-02-04 MED ORDER — PRAVASTATIN SODIUM 80 MG PO TABS: 80.0000 mg | ORAL_TABLET | Freq: Every day | ORAL | Status: AC

## 2015-02-04 MED ORDER — OMEPRAZOLE 20 MG PO CPDR: 20.0000 mg | DELAYED_RELEASE_CAPSULE | Freq: Every day | ORAL | Status: DC

## 2015-02-04 MED ORDER — LOPERAMIDE HCL 2 MG PO TABS: 2.0000 mg | ORAL_TABLET | Freq: Four times a day (QID) | ORAL | Status: AC | PRN

## 2015-02-04 MED ORDER — BUPROPION HCL 75 MG PO TABS: 75.0000 mg | ORAL_TABLET | Freq: Two times a day (BID) | ORAL | Status: DC

## 2015-02-04 MED ORDER — HYDROCHLOROTHIAZIDE 12.5 MG PO TABS: 12.5000 mg | ORAL_TABLET | Freq: Every day | ORAL | Status: DC

## 2015-02-04 MED ORDER — ESTRADIOL 1 MG PO TABS: 1.0000 mg | ORAL_TABLET | Freq: Every day | ORAL | Status: AC

## 2015-02-04 MED ORDER — PRINIVIL 20 MG PO TABS: 20.0000 mg | ORAL_TABLET | Freq: Every day | ORAL | Status: AC

## 2015-02-04 NOTE — Progress Notes (Signed)
Name: Olivia Frazier   MRN: [id number]    DOB: August 10, 1960   Date:02/04/2015       Progress Note  Subjective  Chief Complaint  Chief Complaint  Patient presents with  . Medication Refill    HPI  Olivia Frazier is a 54 year old female here today to request refills of all of her medications. No symptoms or concerns to address.    Past Medical History  Diagnosis Date  . Anxiety   . Arthritis   . Depression   . GERD (gastroesophageal reflux disease)   . Hyperlipidemia   . Hypertension     Patient Active Problem List   Diagnosis Date Noted  . Anxiety 02/04/2015  . Bilateral sensorineural hearing loss 11/01/2014  . Generalized abdominal pain 09/24/2014  . Pain of anterior chest wall with respiration 09/24/2014  . Pelvic pain in female 09/24/2014  . Allergic rhinitis 07/30/2014  . Menopausal syndrome on hormone replacement therapy 07/30/2014  . GAD (generalized anxiety disorder) 07/30/2014  . Hypercholesterolemia with hypertriglyceridemia 07/30/2014  . Coronary atherosclerosis of native coronary artery 07/30/2014  . History of Salmonella gastroenteritis 07/30/2014  . Genital herpes simplex type 2 07/30/2014  . Depression 07/30/2014  . Bilateral change in hearing 06/01/2013  . Irritable bowel syndrome with diarrhea 06/01/2013  . Hemangioma of liver 05/19/2013  . Hypercholesterolemia with hypertriglyceridemia 11/30/2012  . Hypertension goal BP (blood pressure) < 150/90 05/16/2012    Social History  Substance Use Topics  . Smoking status: Former Research scientist (life sciences)  . Smokeless tobacco: Not on file  . Alcohol Use: No     Current outpatient prescriptions:  .  Ascorbic Acid (VITAMIN C) 1000 MG tablet, Take 1 tablet by mouth daily., Disp: , Rfl:  .  buPROPion (WELLBUTRIN) 75 MG tablet, Take 1 tablet by mouth two  times daily, Disp: 180 tablet, Rfl: 1 .  Calcium Carbonate-Vitamin D 600-400 MG-UNIT per tablet, Take 1 tablet by mouth daily., Disp: , Rfl:  .  Chromium 500 MCG TABS,  Take 1 tablet by mouth daily., Disp: , Rfl:  .  diazepam (VALIUM) 5 MG tablet, Take 1 tablet (5 mg total) by mouth 2 (two) times daily as needed for anxiety., Disp: 60 tablet, Rfl: 1 .  estradiol (ESTRACE) 1 MG tablet, Take 1 tablet by mouth  daily, Disp: 90 tablet, Rfl: 3 .  fluticasone (FLONASE) 50 MCG/ACT nasal spray, Use 2 sprays in each  nostril daily, Disp: 48 g, Rfl: 2 .  Garlic 123XX123 MG CAPS, Take 1 capsule by mouth daily., Disp: , Rfl:  .  hydrochlorothiazide (HYDRODIURIL) 12.5 MG tablet, Take 1 tablet (12.5 mg total) by mouth daily., Disp: 30 tablet, Rfl: 3 .  Icosapent Ethyl 1 G CAPS, Take 2 g by mouth 2 (two) times daily., Disp: 40 capsule, Rfl: 0 .  loperamide (IMODIUM A-D) 2 MG tablet, Take 1 tablet (2 mg total) by mouth 4 (four) times daily as needed for diarrhea or loose stools., Disp: 100 tablet, Rfl: 3 .  loratadine (CLARITIN) 10 MG tablet, Take 1 tablet by mouth daily., Disp: , Rfl:  .  Multiple Vitamins tablet, Take 1 tablet by mouth daily., Disp: , Rfl:  .  Omega-3 Fatty Acids (SALMON OIL-1000) 200 MG CAPS, Take 1 capsule by mouth daily., Disp: , Rfl:  .  omeprazole (PRILOSEC) 20 MG capsule, Take 1 capsule (20 mg total) by mouth daily., Disp: 30 capsule, Rfl: 5 .  pravastatin (PRAVACHOL) 80 MG tablet, Take 1 tablet by mouth at  bedtime,  Disp: 90 tablet, Rfl: 3 .  PRINIVIL 20 MG tablet, Take 1 tablet by mouth  daily, Disp: 90 tablet, Rfl: 2 .  valACYclovir (VALTREX) 500 MG tablet, Take 1 tablet by mouth  daily, Disp: 90 tablet, Rfl: 2  Allergies  Allergen Reactions  . Escitalopram Oxalate     Other reaction(s): Dizziness  . Omega-3-Acid Ethyl Esters Other (See Comments)    Hematuria, UTI  . Paroxetine Hcl     Other reaction(s): Headache, Other (See Comments) Rapid pulse  . Sertraline Other (See Comments)    Other reaction(s): Other (See Comments) Rapid pulse    Review of Systems  Negative as mentioned in HPI, otherwise all systems reviewed and are  negative.  Objective  BP 118/76 mmHg  Pulse 86  Temp(Src) 98.1 F (36.7 C) (Oral)  Resp 12  Wt 142 lb 14.4 oz (64.819 kg)  SpO2 97%  Body mass index is 25.32 kg/(m^2).   Physical Exam  Constitutional: Patient appears well-developed and well-nourished. In no distress.  Psychiatric: Patient has a normal mood and affect. Behavior is normal in office today. Judgment and thought content normal in office today.    Assessment & Plan   1. Irritable bowel syndrome with diarrhea Refilled  - loperamide (IMODIUM A-D) 2 MG tablet; Take 1 tablet (2 mg total) by mouth 4 (four) times daily as needed for diarrhea or loose stools.  Dispense: 100 tablet; Refill: 5 - Chromium 500 MCG TABS; Take 1 tablet (500 mcg total) by mouth daily.  Dispense: 90 tablet; Refill: 2  2. Menopausal syndrome on hormone replacement therapy  - estradiol (ESTRACE) 1 MG tablet; Take 1 tablet (1 mg total) by mouth daily.  Dispense: 90 tablet; Refill: 2  3. GAD (generalized anxiety disorder)  - buPROPion (WELLBUTRIN) 75 MG tablet; Take 1 tablet (75 mg total) by mouth 2 (two) times daily.  Dispense: 180 tablet; Refill: 2  4. Hypertension goal BP (blood pressure) < 140/90  - hydrochlorothiazide (HYDRODIURIL) 12.5 MG tablet; Take 1 tablet (12.5 mg total) by mouth daily.  Dispense: 90 tablet; Refill: 2 - PRINIVIL 20 MG tablet; Take 1 tablet (20 mg total) by mouth daily.  Dispense: 90 tablet; Refill: 2  5. GERD without esophagitis  - omeprazole (PRILOSEC) 20 MG capsule; Take 1 capsule (20 mg total) by mouth daily.  Dispense: 90 capsule; Refill: 2  6. Hypercholesterolemia with hypertriglyceridemia  - pravastatin (PRAVACHOL) 80 MG tablet; Take 1 tablet (80 mg total) by mouth daily.  Dispense: 90 tablet; Refill: 2

## 2015-02-11 ENCOUNTER — Ambulatory Visit: Payer: 59 | Admitting: Family Medicine

## 2015-03-04 ENCOUNTER — Ambulatory Visit (INDEPENDENT_AMBULATORY_CARE_PROVIDER_SITE_OTHER): Payer: 59 | Admitting: Family Medicine

## 2015-03-04 ENCOUNTER — Encounter: Payer: Self-pay | Admitting: Family Medicine

## 2015-03-04 VITALS — BP 142/84 | HR 93 | Temp 98.5°F | Resp 16 | Ht 63.0 in | Wt 146.3 lb

## 2015-03-04 DIAGNOSIS — R102 Pelvic and perineal pain: Secondary | ICD-10-CM | POA: Diagnosis not present

## 2015-03-04 DIAGNOSIS — N8110 Cystocele, unspecified: Secondary | ICD-10-CM | POA: Diagnosis not present

## 2015-03-04 NOTE — Progress Notes (Signed)
Name: Olivia Frazier   MRN: [id number]    DOB: 08-31-1960   Date:03/04/2015       Progress Note  Subjective  Chief Complaint  Chief Complaint  Patient presents with  . Pelvic Pain    Woke up early wednesday am with sharp shooting pain and lower abdomen discomfort. Patient states has had 2 surgerys for bladder tacts and thinks bladder might be dropping again.  She states it is even painful to sit down.    HPI  Olivia Frazier is a 55 year old female with complaints of pelvic pain for 3 days now. Sudden onset. Pain scale 4/10, tolerable. Located lower abdomen deep in pelvic area pushing into vaginal vault. Described as a sharp pulling pain. Constant pain. Not associated with dysuria, vaginal discharge or abnormal bleeding or changes in bowel habits or fevers. She has had 2 bladder tact surgeries in the past. Olivia Frazier denies heavy lifting or heavy exercising but has been walking on a treadmill more lately.    Past Medical History  Diagnosis Date  . Anxiety   . Arthritis   . Depression   . GERD (gastroesophageal reflux disease)   . Hyperlipidemia   . Hypertension     Patient Active Problem List   Diagnosis Date Noted  . Anxiety 02/04/2015  . GERD without esophagitis 02/04/2015  . Bilateral sensorineural hearing loss 11/01/2014  . Generalized abdominal pain 09/24/2014  . Pain of anterior chest wall with respiration 09/24/2014  . Pelvic pain in female 09/24/2014  . Allergic rhinitis 07/30/2014  . Menopausal syndrome on hormone replacement therapy 07/30/2014  . GAD (generalized anxiety disorder) 07/30/2014  . Coronary atherosclerosis of native coronary artery 07/30/2014  . History of Salmonella gastroenteritis 07/30/2014  . Genital herpes simplex type 2 07/30/2014  . Depression 07/30/2014  . Bilateral change in hearing 06/01/2013  . Irritable bowel syndrome with diarrhea 06/01/2013  . Hemangioma of liver 05/19/2013  . Hypercholesterolemia with hypertriglyceridemia 11/30/2012  .  Hypertension goal BP (blood pressure) < 150/90 05/16/2012    Social History  Substance Use Topics  . Smoking status: Former Research scientist (life sciences)  . Smokeless tobacco: Not on file  . Alcohol Use: No     Current outpatient prescriptions:  .  Ascorbic Acid (VITAMIN C) 1000 MG tablet, Take 1 tablet by mouth daily., Disp: , Rfl:  .  buPROPion (WELLBUTRIN) 75 MG tablet, Take 1 tablet (75 mg total) by mouth 2 (two) times daily., Disp: 180 tablet, Rfl: 2 .  Calcium Carbonate-Vitamin D 600-400 MG-UNIT per tablet, Take 1 tablet by mouth daily., Disp: , Rfl:  .  Chromium 500 MCG TABS, Take 1 tablet (500 mcg total) by mouth daily., Disp: 90 tablet, Rfl: 2 .  diazepam (VALIUM) 5 MG tablet, Take 1 tablet (5 mg total) by mouth 2 (two) times daily as needed for anxiety., Disp: 60 tablet, Rfl: 1 .  estradiol (ESTRACE) 1 MG tablet, Take 1 tablet (1 mg total) by mouth daily., Disp: 90 tablet, Rfl: 2 .  fluticasone (FLONASE) 50 MCG/ACT nasal spray, Use 2 sprays in each  nostril daily, Disp: 48 g, Rfl: 2 .  Garlic 123XX123 MG CAPS, Take 1 capsule by mouth daily., Disp: , Rfl:  .  hydrochlorothiazide (HYDRODIURIL) 12.5 MG tablet, Take 1 tablet (12.5 mg total) by mouth daily., Disp: 90 tablet, Rfl: 2 .  Icosapent Ethyl 1 G CAPS, Take 2 g by mouth 2 (two) times daily., Disp: 40 capsule, Rfl: 0 .  loperamide (IMODIUM A-D) 2 MG tablet, Take  1 tablet (2 mg total) by mouth 4 (four) times daily as needed for diarrhea or loose stools., Disp: 100 tablet, Rfl: 5 .  loratadine (CLARITIN) 10 MG tablet, Take 1 tablet by mouth daily., Disp: , Rfl:  .  Multiple Vitamins tablet, Take 1 tablet by mouth daily., Disp: , Rfl:  .  Omega-3 Fatty Acids (SALMON OIL-1000) 200 MG CAPS, Take 1 capsule by mouth daily., Disp: , Rfl:  .  omeprazole (PRILOSEC) 20 MG capsule, Take 1 capsule (20 mg total) by mouth daily., Disp: 90 capsule, Rfl: 2 .  pravastatin (PRAVACHOL) 80 MG tablet, Take 1 tablet (80 mg total) by mouth daily., Disp: 90 tablet, Rfl: 2 .   PRINIVIL 20 MG tablet, Take 1 tablet (20 mg total) by mouth daily., Disp: 90 tablet, Rfl: 2 .  valACYclovir (VALTREX) 500 MG tablet, Take 1 tablet by mouth  daily, Disp: 90 tablet, Rfl: 2  Allergies  Allergen Reactions  . Escitalopram Oxalate     Other reaction(s): Dizziness  . Omega-3-Acid Ethyl Esters Other (See Comments)    Hematuria, UTI  . Paroxetine Hcl     Other reaction(s): Headache, Other (See Comments) Rapid pulse  . Sertraline Other (See Comments)    Other reaction(s): Other (See Comments) Rapid pulse    Review of Systems  Positive for pelvic pain as mentioned in HPI, otherwise all systems reviewed and are negative.  Objective  BP 142/84 mmHg  Pulse 93  Temp(Src) 98.5 F (36.9 C) (Oral)  Resp 16  Ht 5\' 3"  (1.6 m)  Wt 146 lb 4.8 oz (66.361 kg)  BMI 25.92 kg/m2  SpO2 96%  Body mass index is 25.92 kg/(m^2).   Physical Exam  Constitutional: Patient appears well-developed and well-nourished. In no distress.    FEMALE GENITALIA:  External genitalia normal External urethra normal Vaginal vault normal without discharge or lesions Cervix normal without discharge or lesions Bimanual exam normal without masses RECTAL: no rectal masses or hemorrhoids  Cardiovascular: Normal rate, regular rhythm and normal heart sounds.  No murmur heard.  Pulmonary/Chest: Effort normal and breath sounds normal. No respiratory distress. Musculoskeletal: Normal range of motion bilateral UE and LE, no joint effusions. Peripheral vascular: Bilateral LE no edema. Psychiatric: Patient has an anxious mood and affect. Behavior is normal in office today. Judgment and thought content normal in office today.    Assessment & Plan   1. Pelvic pain in female Will rule out STD pelvic infection for complete work up however her symptoms are most likely from bladder prolapse.   - Chlamydia/Gonococcus/Trichomonas, NAA; Future - Ambulatory referral to Urology  2. Bladder prolapse, female,  acquired Pain from obvious bladder prolapse. Will get urgent consultation with Urology. Advised patient to avoid any lifting, running, jumping at this time.  - Chlamydia/Gonococcus/Trichomonas, NAA; Future - Ambulatory referral to Urology

## 2015-03-10 ENCOUNTER — Encounter: Payer: Self-pay | Admitting: Family Medicine

## 2015-03-18 ENCOUNTER — Ambulatory Visit: Payer: 59

## 2015-04-12 HISTORY — PX: COCHLEAR IMPLANT: SHX184

## 2015-06-28 ENCOUNTER — Other Ambulatory Visit: Payer: Self-pay

## 2015-06-28 DIAGNOSIS — I10 Essential (primary) hypertension: Secondary | ICD-10-CM

## 2015-06-28 MED ORDER — FLUTICASONE PROPIONATE 50 MCG/ACT NA SUSP: 2.0000 | Freq: Every day | NASAL | Status: AC

## 2015-06-28 MED ORDER — VALACYCLOVIR HCL 500 MG PO TABS: 500.0000 mg | ORAL_TABLET | Freq: Every day | ORAL | Status: AC

## 2015-06-28 MED ORDER — HYDROCHLOROTHIAZIDE 12.5 MG PO TABS: 12.5000 mg | ORAL_TABLET | Freq: Every day | ORAL | Status: AC

## 2015-06-28 NOTE — Telephone Encounter (Signed)
Pt has appt this week; will need Cr, -lytes; Rx approved

## 2015-07-01 ENCOUNTER — Ambulatory Visit: Payer: 59 | Admitting: Family Medicine

## 2015-08-05 ENCOUNTER — Encounter: Payer: Self-pay | Admitting: Family Medicine

## 2015-08-05 ENCOUNTER — Ambulatory Visit (INDEPENDENT_AMBULATORY_CARE_PROVIDER_SITE_OTHER): Payer: 59 | Admitting: Family Medicine

## 2015-08-05 VITALS — BP 116/72 | HR 88 | Temp 98.3°F | Resp 16 | Wt 146.0 lb

## 2015-08-05 DIAGNOSIS — R635 Abnormal weight gain: Secondary | ICD-10-CM | POA: Insufficient documentation

## 2015-08-05 DIAGNOSIS — D1803 Hemangioma of intra-abdominal structures: Secondary | ICD-10-CM | POA: Diagnosis not present

## 2015-08-05 DIAGNOSIS — F419 Anxiety disorder, unspecified: Secondary | ICD-10-CM | POA: Diagnosis not present

## 2015-08-05 DIAGNOSIS — E782 Mixed hyperlipidemia: Secondary | ICD-10-CM

## 2015-08-05 DIAGNOSIS — K219 Gastro-esophageal reflux disease without esophagitis: Secondary | ICD-10-CM | POA: Diagnosis not present

## 2015-08-05 DIAGNOSIS — Z5181 Encounter for therapeutic drug level monitoring: Secondary | ICD-10-CM

## 2015-08-05 DIAGNOSIS — I700 Atherosclerosis of aorta: Secondary | ICD-10-CM | POA: Diagnosis not present

## 2015-08-05 HISTORY — DX: Atherosclerosis of aorta: I70.0

## 2015-08-05 MED ORDER — RANITIDINE HCL 300 MG PO TABS: 300.0000 mg | ORAL_TABLET | Freq: Every day | ORAL | Status: AC

## 2015-08-05 MED ORDER — BUPROPION HCL ER (XL) 150 MG PO TB24: 150.0000 mg | ORAL_TABLET | Freq: Every day | ORAL | Status: AC

## 2015-08-05 NOTE — Patient Instructions (Addendum)
Please have fasting labs done at your convenience  Try to use PLAIN allergy medicine without the decongestant Avoid: phenylephrine, phenylpropanolamine, and pseudoephredine  Your goal blood pressure is less than 140 mmHg on top. Try to follow the DASH guidelines (DASH stands for Dietary Approaches to Stop Hypertension) Try to limit the sodium in your diet.  Ideally, consume less than 1.5 grams (less than 1,500mg ) per day. Do not add salt when cooking or at the table.  Check the sodium amount on labels when shopping, and choose items lower in sodium when given a choice. Avoid or limit foods that already contain a lot of sodium. Eat a diet rich in fruits and vegetables and whole grains.  Taper off of the omeprazole because of the dangers Start 300 mg ranitidine at night Avoid all the triggers If heartburn worsens, call me  Check out the information at familydoctor.org entitled "Nutrition for Weight Loss: What You Need to Know about Fad Diets" Try to lose between 1-2 pounds per week by taking in fewer calories and burning off more calories You can succeed by limiting portions, limiting foods dense in calories and fat, becoming more active, and drinking 8 glasses of water a day (64 ounces) Don't skip meals, especially breakfast, as skipping meals may alter your metabolism Do not use over-the-counter weight loss pills or gimmicks that claim rapid weight loss A healthy BMI (or body mass index) is between 18.5 and 24.9 You can calculate your ideal BMI at the [place] website [employer]fr    Food Choices to Lower Your Triglycerides Triglycerides are a type of fat in your blood. High levels of triglycerides can increase the risk of heart disease and stroke. If your triglyceride levels are high, the foods you eat and your eating habits are very important. Choosing the right foods can help lower your triglycerides.  WHAT GENERAL GUIDELINES DO I NEED  TO FOLLOW?  Lose weight if you are overweight.   Limit or avoid alcohol.   Fill one half of your plate with vegetables and green salads.   Limit fruit to two servings a day. Choose fruit instead of juice.   Make one fourth of your plate whole grains. Look for the word "whole" as the first word in the ingredient list.  Fill one fourth of your plate with lean protein foods.  Enjoy fatty fish (such as salmon, mackerel, sardines, and tuna) three times a week.   Choose healthy fats.   Limit foods high in starch and sugar.  Eat more home-cooked food and less restaurant, buffet, and fast food.  Limit fried foods.  Cook foods using methods other than frying.  Limit saturated fats.  Check ingredient lists to avoid foods with partially hydrogenated oils (trans fats) in them. WHAT FOODS CAN I EAT?  Grains Whole grains, such as whole wheat or whole grain breads, crackers, cereals, and pasta. Unsweetened oatmeal, bulgur, barley, quinoa, or brown rice. Corn or whole wheat flour tortillas.  Vegetables Fresh or frozen vegetables (raw, steamed, roasted, or grilled). Green salads. Fruits All fresh, canned (in natural juice), or frozen fruits. Meat and Other Protein Products Ground beef (85% or leaner), grass-fed beef, or beef trimmed of fat. Skinless chicken or [place]. Ground chicken or [place]. Pork trimmed of fat. All fish and seafood. Eggs. Dried beans, peas, or lentils. Unsalted nuts or seeds. Unsalted canned or dry beans. Dairy Low-fat dairy products, such as skim or 1% milk, 2% or reduced-fat cheeses, low-fat ricotta or cottage cheese, or plain low-fat yogurt.  Fats and Oils Tub margarines without trans fats. Light or reduced-fat mayonnaise and salad dressings. Avocado. Safflower, olive, or canola oils. Natural peanut or almond butter. The items listed above may not be a complete list of recommended foods or beverages. Contact your dietitian for more options. WHAT FOODS ARE NOT  RECOMMENDED?  Grains White bread. White pasta. White rice. Cornbread. Bagels, pastries, and croissants. Crackers that contain trans fat. Vegetables White potatoes. Corn. Creamed or fried vegetables. Vegetables in a cheese sauce. Fruits Dried fruits. Canned fruit in light or heavy syrup. Fruit juice. Meat and Other Protein Products Fatty cuts of meat. Ribs, chicken wings, bacon, sausage, bologna, salami, chitterlings, fatback, hot dogs, bratwurst, and packaged luncheon meats. Dairy Whole or 2% milk, cream, half-and-half, and cream cheese. Whole-fat or sweetened yogurt. Full-fat cheeses. Nondairy creamers and whipped toppings. Processed cheese, cheese spreads, or cheese curds. Sweets and Desserts Corn syrup, sugars, honey, and molasses. Candy. Jam and jelly. Syrup. Sweetened cereals. Cookies, pies, cakes, donuts, muffins, and ice cream. Fats and Oils Butter, stick margarine, lard, shortening, ghee, or bacon fat. Coconut, palm kernel, or palm oils. Beverages Alcohol. Sweetened drinks (such as sodas, lemonade, and fruit drinks or punches). The items listed above may not be a complete list of foods and beverages to avoid. Contact your dietitian for more information.   This information is not intended to replace advice given to you by your health care provider. Make sure you discuss any questions you have with your health care provider.   Document Released: 11/10/2003 Document Revised: 02/12/2014 Document Reviewed: 11/26/2012 Elsevier Interactive Patient Education [employer].

## 2015-08-05 NOTE — Assessment & Plan Note (Signed)
Check thyroid   

## 2015-08-05 NOTE — Assessment & Plan Note (Signed)
PPI risks; avoid triggers; wean off PPI; start ranitidine

## 2015-08-05 NOTE — Assessment & Plan Note (Addendum)
Try to eat healthier; check fasting lipids; sounds to be hereditary, with father's elevated TG and heart disease

## 2015-08-05 NOTE — Progress Notes (Signed)
BP 116/72 mmHg  Pulse 88  Temp(Src) 98.3 F (36.8 C) (Oral)  Resp 16  Wt 146 lb (66.225 kg)  SpO2 93%   Subjective:    Patient ID: Olivia Frazier, female    DOB: 02-08-1960, 55 y.o.   MRN: [id number]  HPI: Olivia Frazier is a 55 y.o. female  Chief Complaint  Patient presents with  . Medication Refill   Here for follow-up No medical excitement since last visit Trouble with triglycerides, going on for years; runs in the family, father's ran around 600  Her nails are very brittle; wants thyroid checked; cracks at the bottoom; using clear polish to try to strengthen them; has gained a little bit of weight; hungry a lot; no hair loss; hair is dry, keeps lotion on the skin Stomach bloating; they did a scan she says; the scan was a year ago; she has one ovary, thought she had two, but they couldn't find the other one on the scan  Taking wellbutrin 75 mg twice a day, taking for a while; has to deal with a lot of stuff; kind of blue, gets down sometimes; looks after her mother full time; did not tolerated celexa, sexual side effects; also has benzo on the med list, but she says she can do without this when I discussed risks of benzo and controlled substance guidelines  Taking HCTZ and ACE-I  Omeprazole; has heartburn, had an ulcer before; had severe indigestion  Depression screen [facility] 2/9 08/05/2015 08/05/2015 02/04/2015 07/30/2014  Decreased Interest 0 0 0 3  Down, Depressed, Hopeless 1 0 0 3  PHQ - 2 Score 1 0 0 6  Altered sleeping - - - 3  Tired, decreased energy - - - 1  Change in appetite - - - 1  Feeling bad or failure about yourself  - - - 1  Trouble concentrating - - - 1  Moving slowly or fidgety/restless - - - 1  Suicidal thoughts - - - 0  PHQ-9 Score - - - 14  Difficult doing work/chores - - - Not difficult at all   Relevant past medical, surgical, family and social history reviewed Past Medical History  Diagnosis Date  . Anxiety   . Arthritis   . Depression   .  GERD (gastroesophageal reflux disease)   . Hyperlipidemia   . Hypertension   . Calcification of aorta ([facility]CC) 08/05/2015  . Hypertension goal BP (blood pressure) < 140/90 05/16/2012   Past Surgical History  Procedure Laterality Date  . Abdominal hysterectomy    . Tubal ligation    . 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 Arthritis Mother   . Cancer Mother     melanoma on her leg  . Hearing loss Mother   . Heart disease Mother     leaky valve  . Hypertension Mother     history of  . Kidney disease Mother   . Vision loss Mother   . Thrombosis Mother     history of in right leg  . Dementia Mother   . Diabetes Father   . Stroke Father   . Hypertension Father   . Cancer Maternal Aunt     breast  . Diverticulitis Maternal Grandmother   . Stroke Maternal Grandmother     had a brain tumor  . Early death Maternal Grandfather   . Stroke Maternal Grandfather  Social History  Substance Use Topics  . Smoking status: Former Research scientist (life sciences)  . Smokeless tobacco: None  . Alcohol Use: No    Interim medical history since last visit reviewed. Allergies and medications reviewed  Review of Systems Per HPI unless specifically indicated above     Objective:    BP 116/72 mmHg  Pulse 88  Temp(Src) 98.3 F (36.8 C) (Oral)  Resp 16  Wt 146 lb (66.225 kg)  SpO2 93%  Wt Readings from Last 3 Encounters:  08/05/15 146 lb (66.225 kg)  03/04/15 146 lb 4.8 oz (66.361 kg)  02/04/15 142 lb 14.4 oz (64.819 kg)    Physical Exam  Constitutional: She appears well-developed and well-nourished.  HENT:  Mouth/Throat: Mucous membranes are normal.  presbycusis  Eyes: EOM are normal. No scleral icterus.  Cardiovascular: Normal rate and regular rhythm.   Pulmonary/Chest: Effort normal and breath sounds normal.  Neurological: She displays no tremor. Gait normal.  No tics  Skin: She is not diaphoretic.  No pallor.  Psychiatric: She has a normal mood and affect. Her behavior is normal. Her mood appears not anxious. She does not exhibit a depressed mood.    Results for orders placed or performed in visit on 09/27/14  Hepatic function panel  Result Value Ref Range   Total Protein 6.5 6.0 - 8.5 g/dL   Albumin 4.5 3.5 - 5.5 g/dL   Bilirubin Total <0.2 0.0 - 1.2 mg/dL   Bilirubin, Direct 0.07 0.00 - 0.40 mg/dL   Alkaline Phosphatase 56 39 - 117 IU/L   AST 18 0 - 40 IU/L   ALT 13 0 - 32 IU/L      Assessment & Plan:   Problem List Items Addressed This Visit      Cardiovascular and Mediastinum   Hemangioma of liver    CT scan done 05/01/13 shows liver hemangioma, f/u MRI with and without contrast in 6 months recommended-- MRI was negative; no further follow-up recommended per report      Calcification of aorta ([place])    Noted on chest CT from 2015; reviewed with patient today; encouraged weight management, healthy eating, goal LDL under 70        Digestive   GERD without esophagitis    PPI risks; avoid triggers; wean off PPI; start ranitidine      Relevant Medications   omeprazole (PRILOSEC) 20 MG capsule   ranitidine (ZANTAC) 300 MG tablet     Other   Weight gain, abnormal    Check thyroid      Relevant Orders   TSH   Hypercholesterolemia with hypertriglyceridemia - Primary    Try to eat healthier; check fasting lipids; sounds to be hereditary, with father's elevated TG and heart disease      Relevant Orders   Lipid Profile   Anxiety    Will use extended release wellbutrin; I dissuaded patient from use of benzos and she agrees she can go find without them      Relevant Medications   buPROPion (WELLBUTRIN XL) 150 MG 24 hr tablet    Other Visit Diagnoses    Medication monitoring encounter        Relevant Orders    Comprehensive metabolic panel       Follow up plan: Return in about 3 months (around 11/05/2015) for fasting labs and visit, weight check.  An  after-visit summary was printed and given to the patient at [facility].  Please see the patient instructions which may contain other information and  recommendations beyond what is mentioned above in the assessment and plan.  Meds ordered this encounter  Medications  . buPROPion (WELLBUTRIN XL) 150 MG 24 hr tablet    Sig: Take 1 tablet (150 mg total) by mouth daily.    Dispense:  30 tablet    Refill:  0    Changing to long-acting  . omeprazole (PRILOSEC) 20 MG capsule    Sig: One by mouth every other day for 2 weeks, then every 3rd day for 2 weeks, then stop  . ranitidine (ZANTAC) 300 MG tablet    Sig: Take 1 tablet (300 mg total) by mouth at bedtime.    Dispense:  30 tablet    Refill:  0   Medications Discontinued During This Encounter  Medication Reason  . Icosapent Ethyl 1 G CAPS   . Garlic [id number] MG CAPS   . diazepam (VALIUM) 5 MG tablet   . buPROPion (WELLBUTRIN) 75 MG tablet Change in therapy  . omeprazole (PRILOSEC) 20 MG capsule Reorder     Orders Placed This Encounter  Procedures  . Lipid Profile  . Comprehensive metabolic panel  . TSH

## 2015-08-07 ENCOUNTER — Encounter: Payer: Self-pay | Admitting: Family Medicine

## 2015-08-07 NOTE — Assessment & Plan Note (Signed)
CT scan done 05/01/13 shows liver hemangioma, f/u MRI with and without contrast in 6 months recommended-- MRI was negative; no further follow-up recommended per report

## 2015-08-07 NOTE — Assessment & Plan Note (Signed)
Will use extended release wellbutrin; I dissuaded patient from use of benzos and she agrees she can go find without them

## 2015-08-07 NOTE — Assessment & Plan Note (Signed)
Noted on chest CT from 2015; reviewed with patient today; encouraged weight management, healthy eating, goal LDL under 70

## 2015-08-15 ENCOUNTER — Encounter: Payer: Self-pay | Admitting: Family Medicine

## 2015-09-23 LAB — COMPREHENSIVE METABOLIC PANEL
ALK PHOS: 60 IU/L (ref 39–117)
ALT: 17 IU/L (ref 0–32)
AST: 22 IU/L (ref 0–40)
Albumin/Globulin Ratio: 1.8 (ref 1.2–2.2)
Albumin: 4.2 g/dL (ref 3.5–5.5)
BUN/Creatinine Ratio: 24 — ABNORMAL HIGH (ref 9–23)
BUN: 15 mg/dL (ref 6–24)
Bilirubin Total: 0.3 mg/dL (ref 0.0–1.2)
CHLORIDE: 99 mmol/L (ref 96–106)
CO2: 23 mmol/L (ref 18–29)
CREATININE: 0.63 mg/dL (ref 0.57–1.00)
Calcium: 9.4 mg/dL (ref 8.7–10.2)
GFR calc Af Amer: 115 mL/min/{1.73_m2} (ref >59)
GFR calc non Af Amer: 100 mL/min/{1.73_m2} (ref >59)
GLOBULIN, TOTAL: 2.4 g/dL (ref 1.5–4.5)
Glucose: 106 mg/dL — ABNORMAL HIGH (ref 65–99)
POTASSIUM: 4.3 mmol/L (ref 3.5–5.2)
SODIUM: 141 mmol/L (ref 134–144)
Total Protein: 6.6 g/dL (ref 6.0–8.5)

## 2015-09-23 LAB — LIPID PANEL
CHOLESTEROL TOTAL: 190 mg/dL (ref 100–199)
Chol/HDL Ratio: 4.5 ratio units — ABNORMAL HIGH (ref 0.0–4.4)
HDL: 42 mg/dL (ref >39)
Triglycerides: 477 mg/dL — ABNORMAL HIGH (ref 0–149)

## 2015-09-23 LAB — TSH: TSH: 3.09 u[IU]/mL (ref 0.450–4.500)

## 2015-10-08 ENCOUNTER — Other Ambulatory Visit: Payer: Self-pay | Admitting: Family Medicine

## 2015-10-08 DIAGNOSIS — E782 Mixed hyperlipidemia: Secondary | ICD-10-CM

## 2015-10-08 NOTE — Progress Notes (Signed)
Orders for fasting lipids around mid October

## 2015-10-08 NOTE — Assessment & Plan Note (Signed)
Increase fish oil; work on weight loss and healthier eating; recheck lipids fasting in 6 weeks

## 2016-05-05 IMAGING — CR DG CHEST 2V
1 series · 2 of 2 positions shown · non-contrast
Comparison: None.

CLINICAL DATA: Anterior chest wall pain with respiration

EXAM:
CHEST  2 VIEW

[Series 1: dg chest 2 view · 0.14mm/px · 2 of 2 slices shown]
[im 1/2]
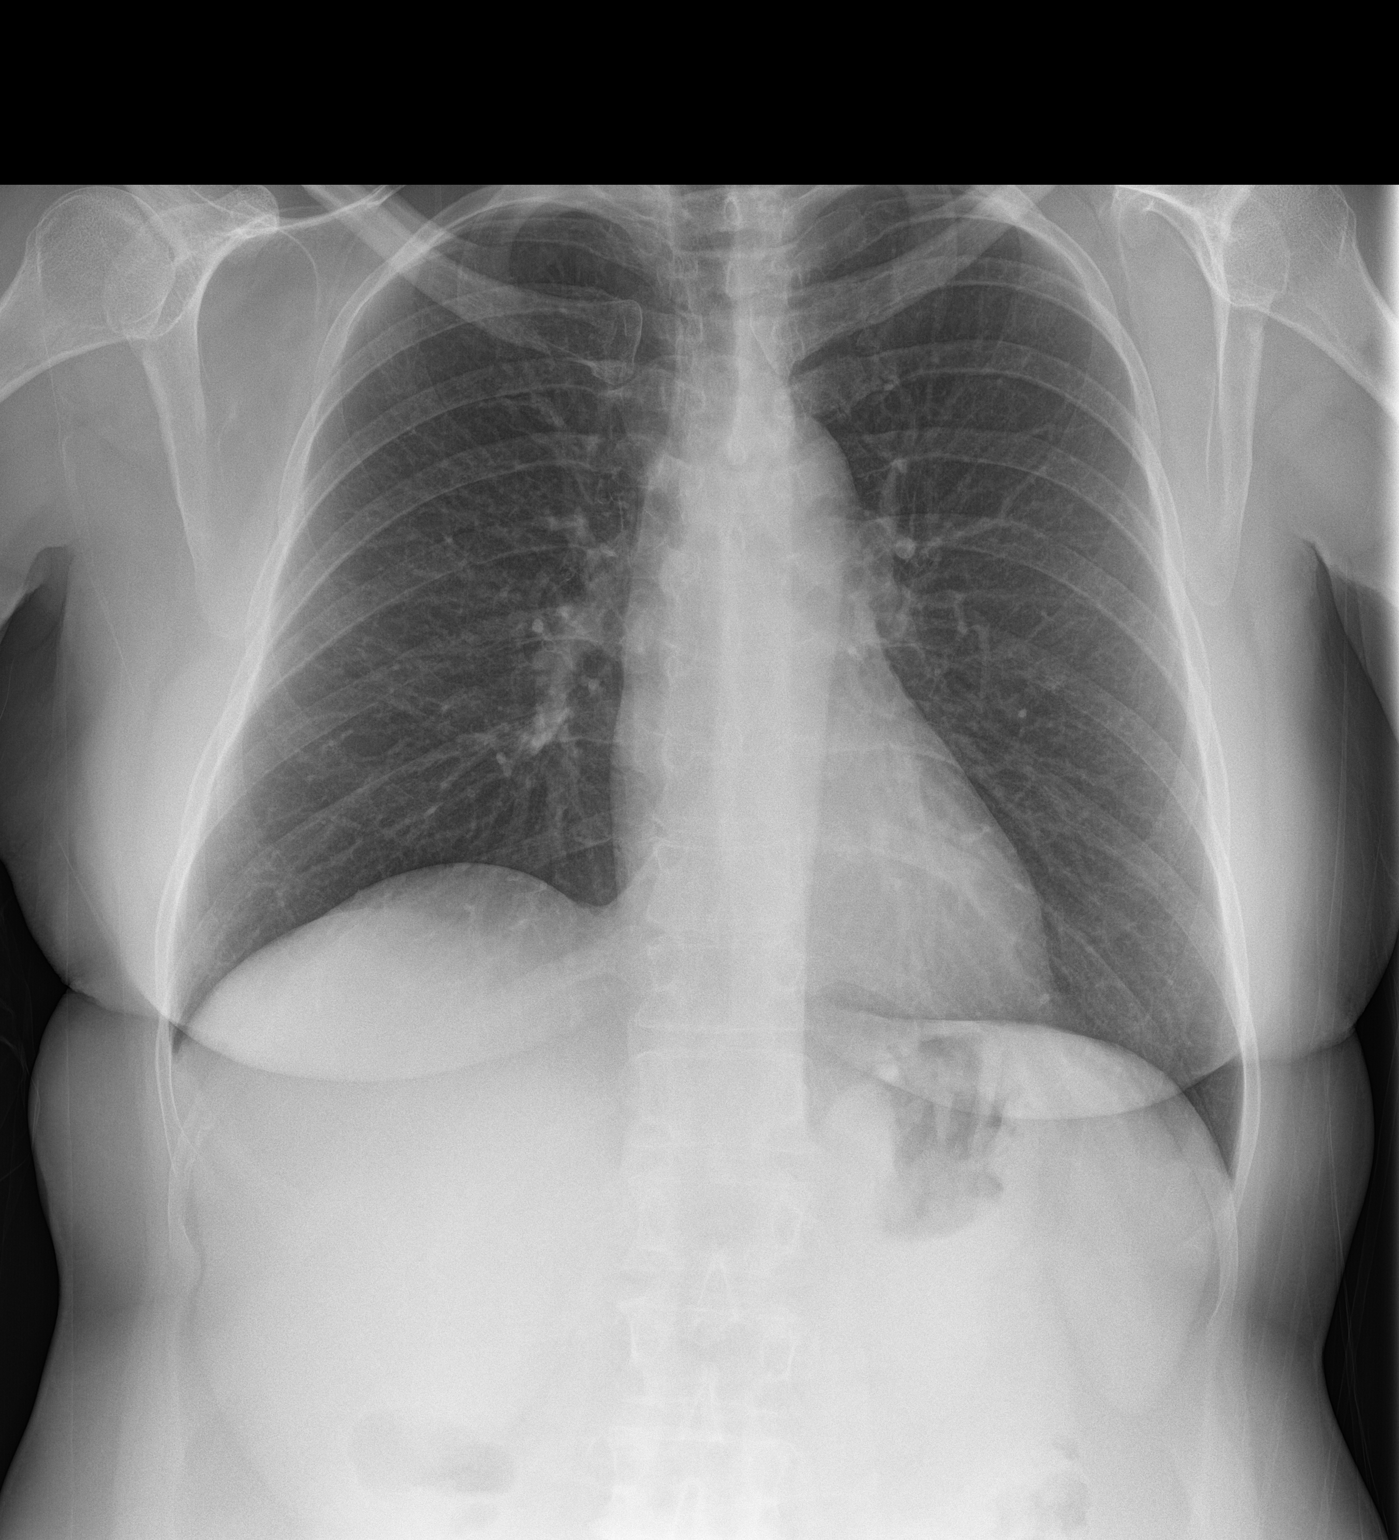
[im 2/2]
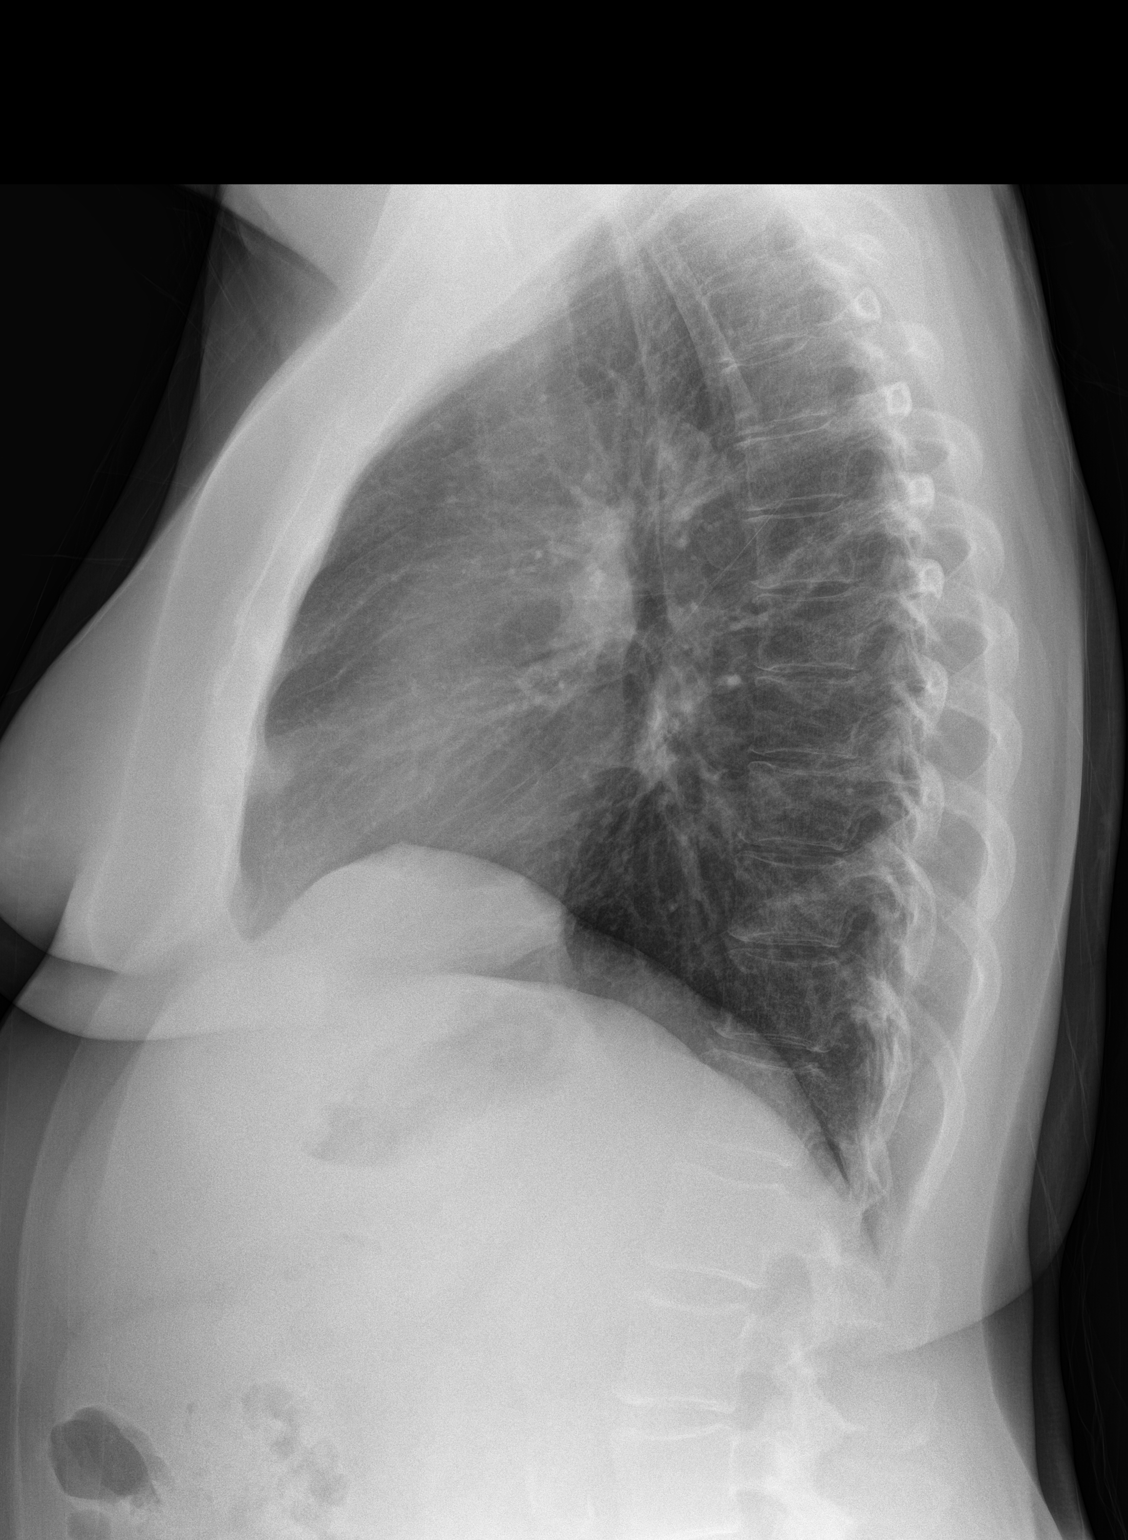

[2 of 2 positions shown; findings below may reference images not displayed]

FINDINGS: Slight elevation of the right hemidiaphragm. Lungs are clear. Heart
is normal size. No effusions or acute bony abnormality.
IMPRESSION: No active cardiopulmonary disease.

## 2016-05-05 IMAGING — CR DG PELVIS 1-2V
1 series · 1 of 1 positions shown · non-contrast
Comparison: None.

ADDENDUM:
The patient is cleared for MRI.
CLINICAL DATA: Pre MRI. History of bladder tack and staples in
place.

EXAM:
PELVIS - 1-2 VIEW

[dg pelvis 1-2 views]
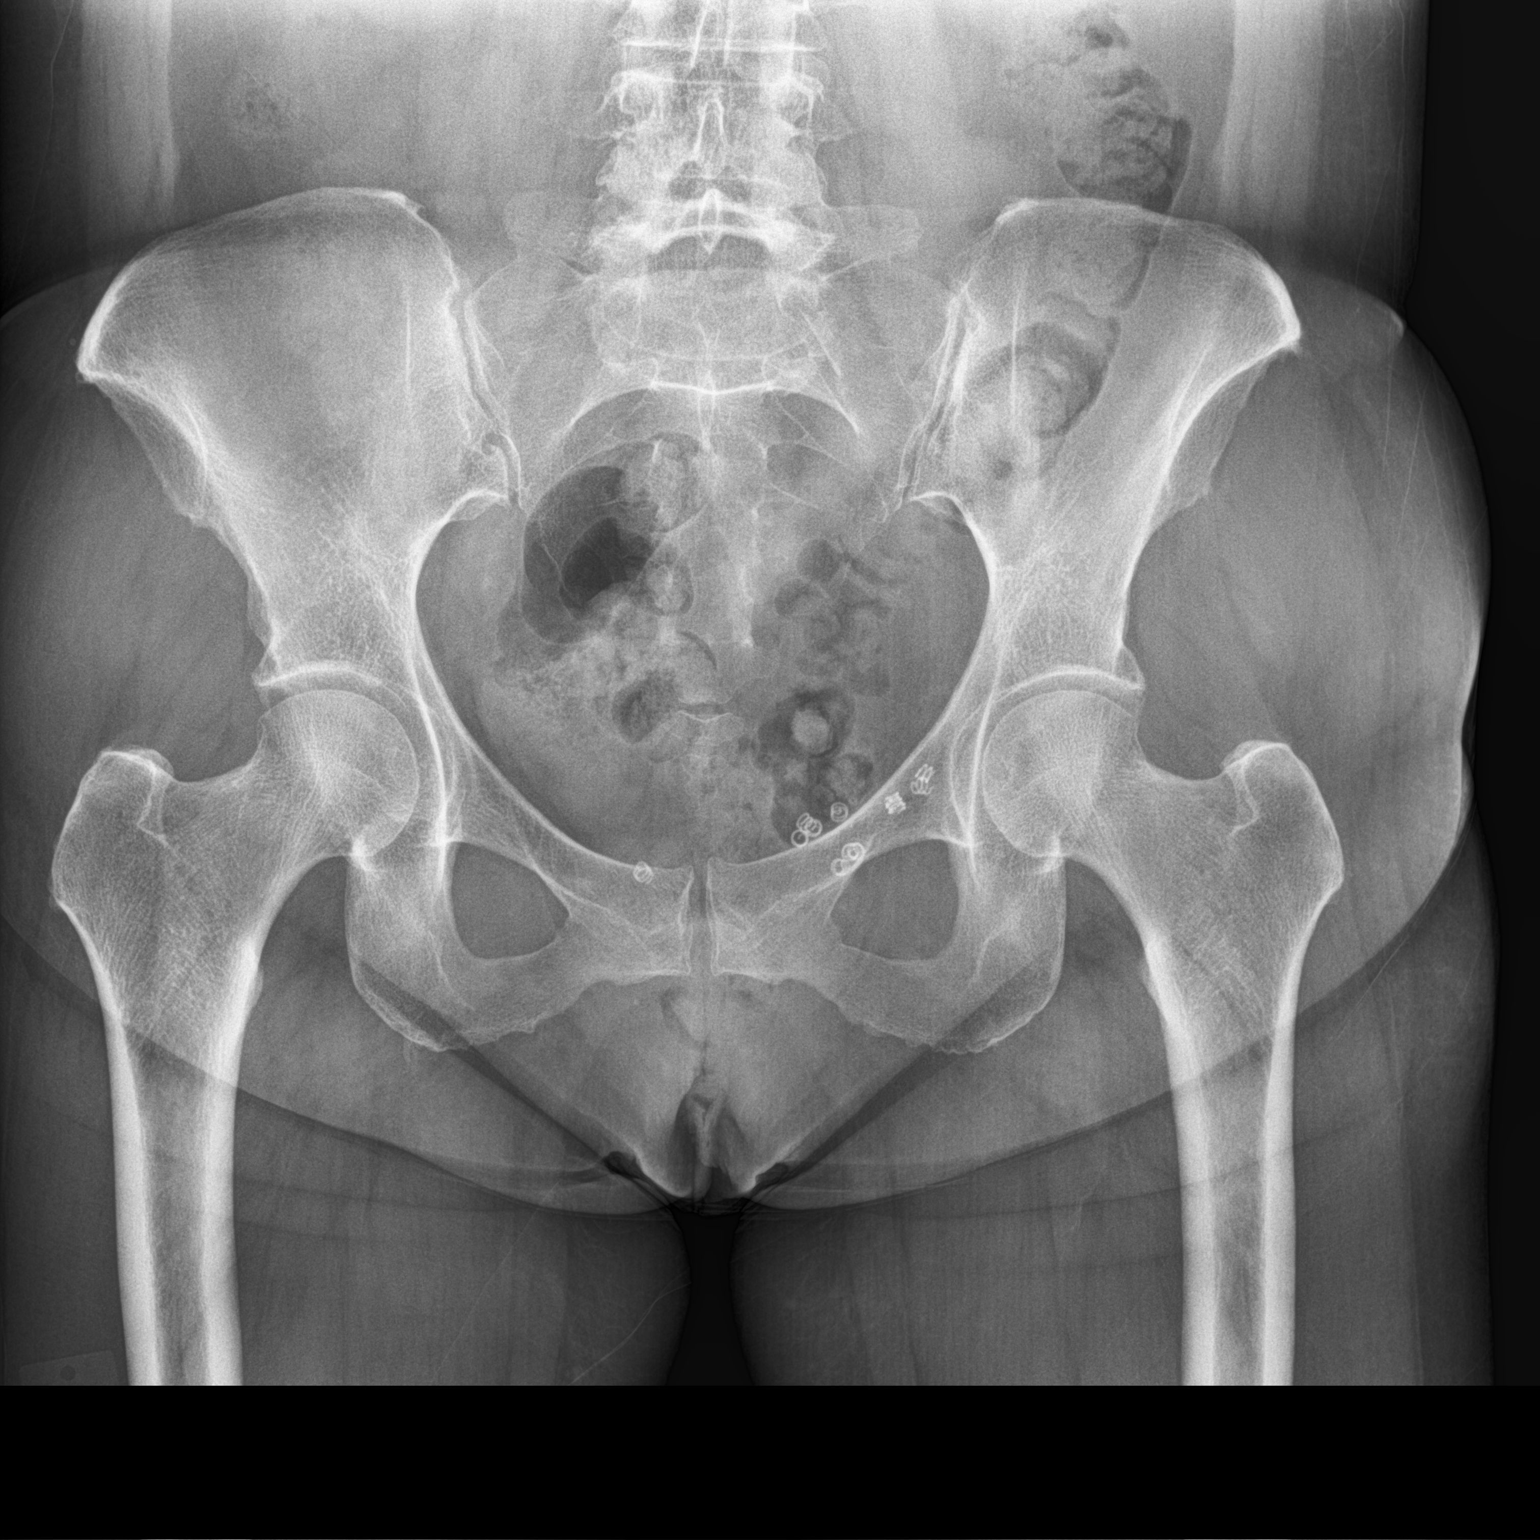

[1 of 1 positions shown; findings below may reference images not displayed]

FINDINGS: No acute bony abnormality. SI joints and hip joints are symmetric
and unremarkable.

Surgical coils are noted in the lower pelvis, predominantly to the
left of midline overlying the left superior pubic ramus. Single
surgical projects over the right pubic bone.
IMPRESSION: Small surgical coils project over the lower pelvis.

## 2016-05-05 IMAGING — CR DG ABDOMEN 2V
1 series · 2 of 2 positions shown · non-contrast
Comparison: Pelvis image performed today.

CLINICAL DATA: Generalized abdominal pain. History of bladder tack.
Pre MRI.

EXAM:
ABDOMEN - 2 VIEW

[Series 1: dg abd 2 views · 0.14mm/px · 2 of 2 slices shown]
[im 1/2]
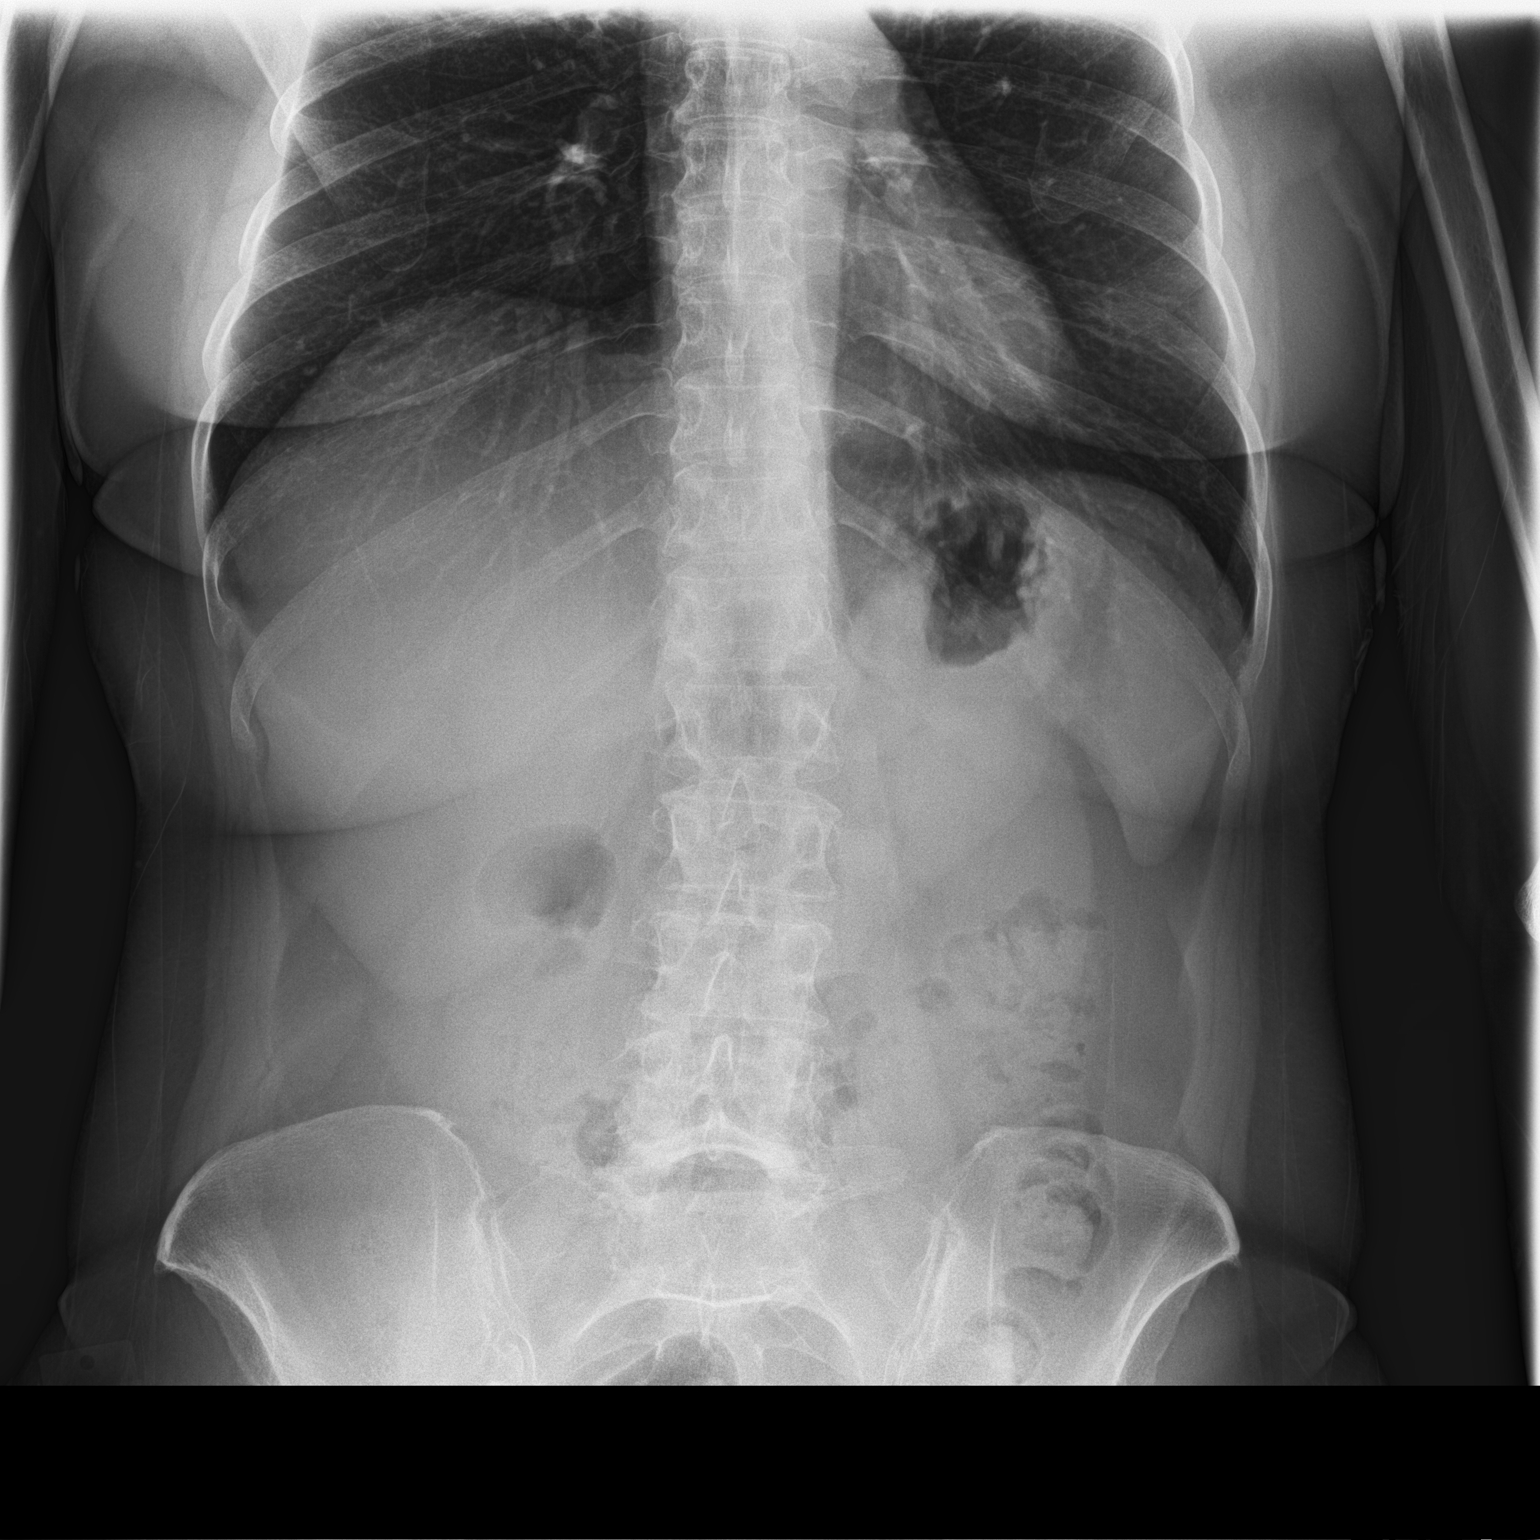
[im 2/2]
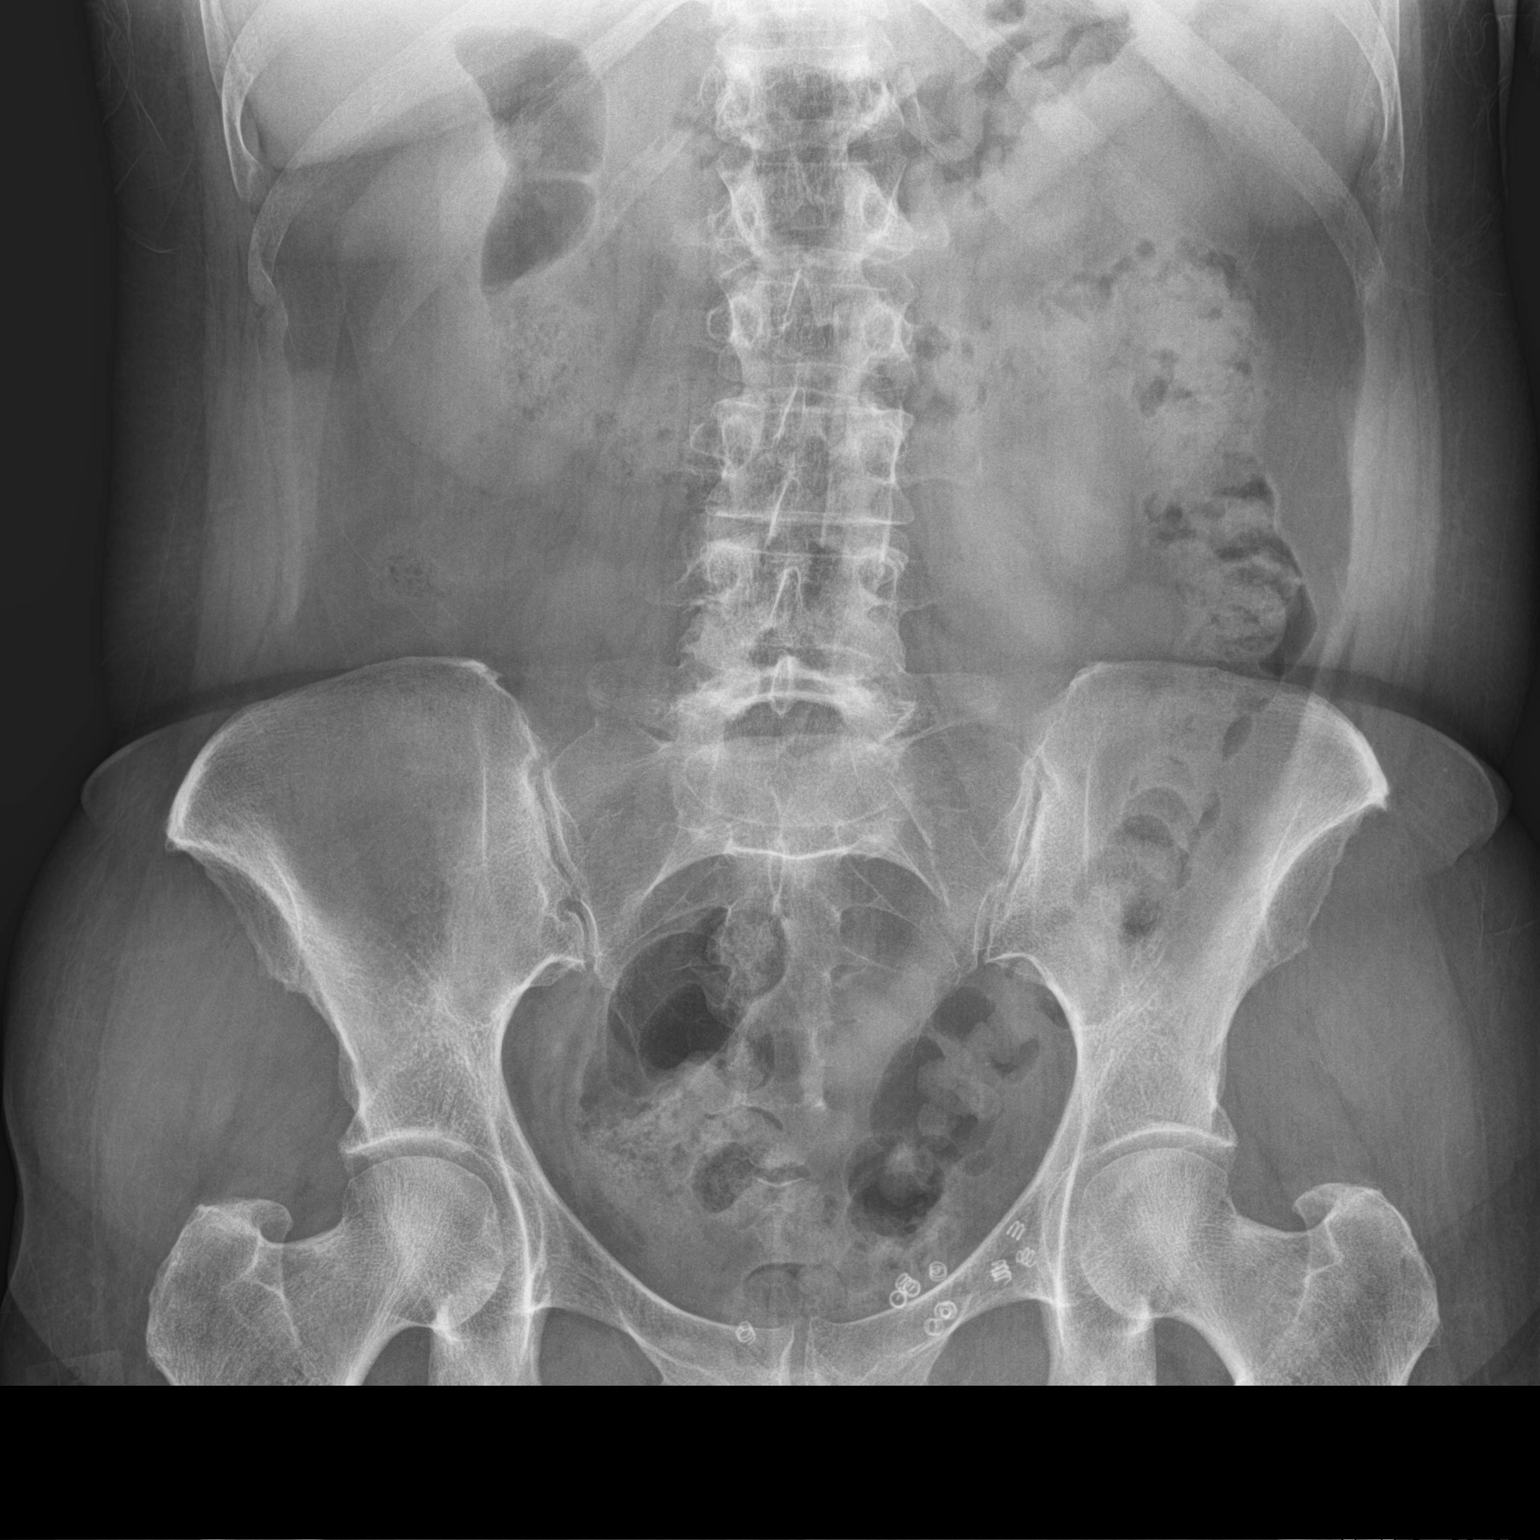

[2 of 2 positions shown; findings below may reference images not displayed]

FINDINGS: As described on the pelvis film, there are small surgical coils
projecting over the lower pelvis along the left superior pubic ramus
and right pubic bone. Nonobstructive bowel gas pattern. No
organomegaly. No free air or suspicious calcification. Visualized
lung bases clear.
IMPRESSION: No acute findings.

Small surgical coils project over the lower pelvis as described
above and on pelvic film.

## 2016-05-12 IMAGING — MR MR ABDOMEN WO/W CM
10 of 16 series · 27 of 48 positions shown · IV contrast (13 ML MULTIHANCE)
Comparison: 05/01/2013

CLINICAL DATA: Distended abdomen for 5 months. Mild tenderness. CT
scan 05/01/2013 showed a hypervascular right hepatic lobe lesion.

EXAM:
MRI ABDOMEN WITHOUT AND WITH CONTRAST
TECHNIQUE: Multiplanar multisequence MR imaging of the abdomen was performed
both before and after the administration of intravenous contrast.
CONTRAST:  13mL MULTIHANCE GADOBENATE DIMEGLUMINE 529 MG/ML IV SOLN

[Series 2: T2 · coronal · 8.0mm · 1.56mm/px · 1 of 24 slices shown]
[im 1/24]
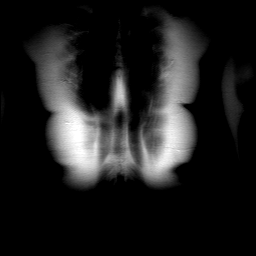

[Series 3: T2 fat-sat · axial · 7.0mm · 0.74mm/px · 1 of 27 slices shown]
[im 1/27]
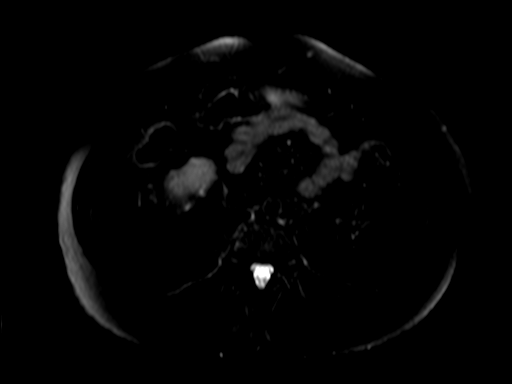

[Series 5: axial true fisp-- · axial · 4.0mm · 0.74mm/px · z∈[-76,+144]mm · 3 of 56 slices shown]
[im 1/56]
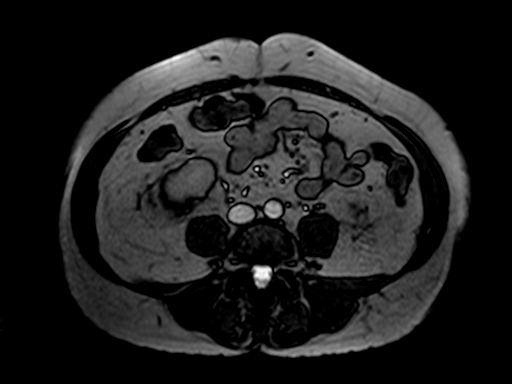
[im 28/56]
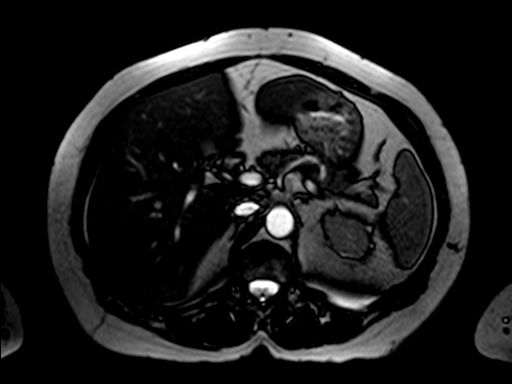
[im 56/56]
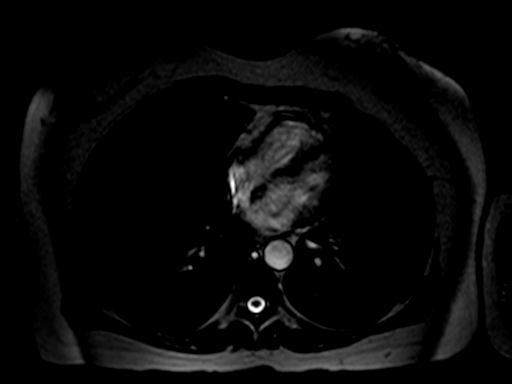

[Series 11: T2 post-contrast · axial · 8.0mm · 1.48mm/px · z∈[-77,+144]mm · 2 of 24 slices shown]
[im 1/24]
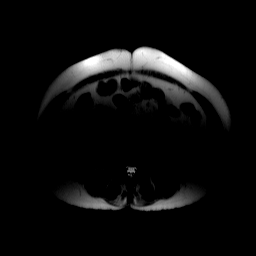
[im 24/24]
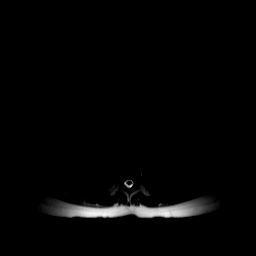

[Series 13: out of phase · axial · 7.0mm · 0.74mm/px · z∈[-75,+143]mm · 2 of 27 slices shown]
[im 1/27]
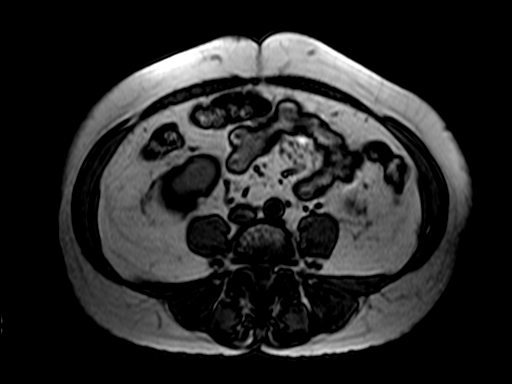
[im 27/27]
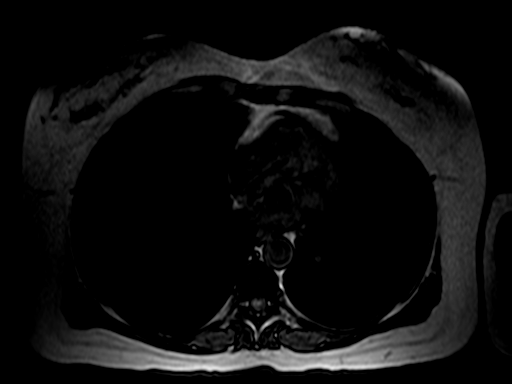

[Series 15: immed sub · axial · 4.0mm · 0.74mm/px · z∈[-76,+144]mm · 4 of 56 slices shown]
[im 1/56]
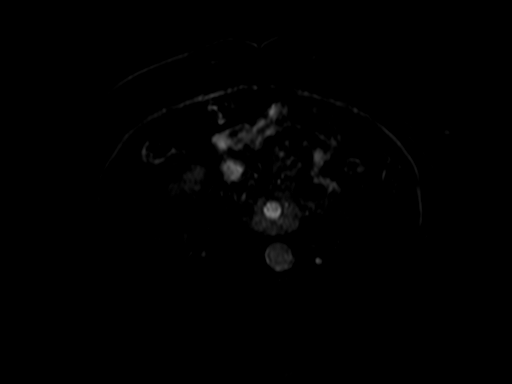
[im 19/56]
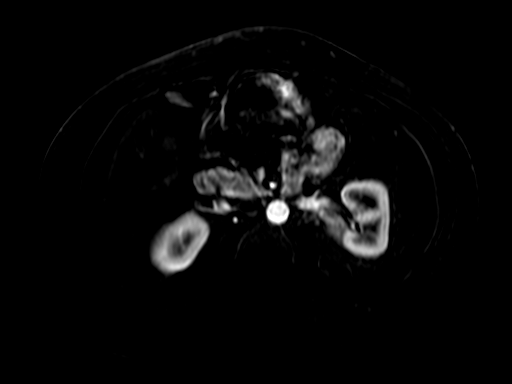
[im 37/56]
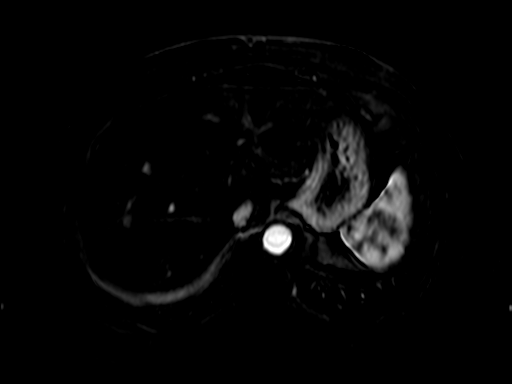
[im 56/56]
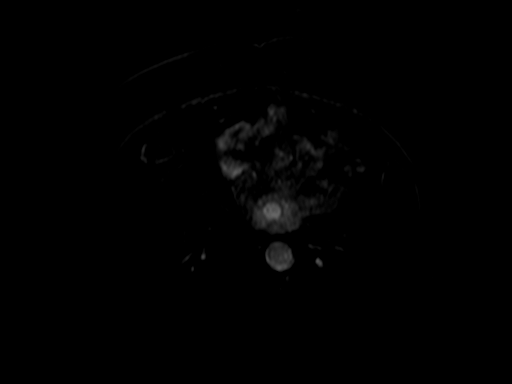

[Series 16: 45 sec sub · axial · 4.0mm · 0.74mm/px · z∈[-76,+144]mm · 4 of 56 slices shown]
[im 1/56]
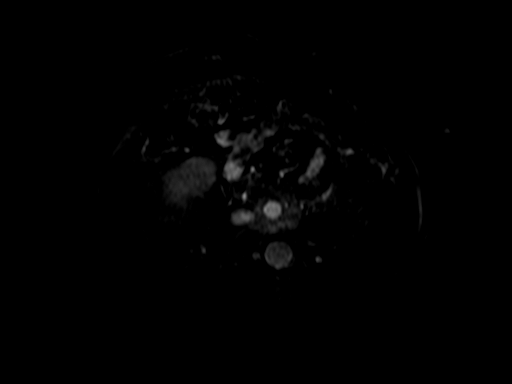
[im 19/56]
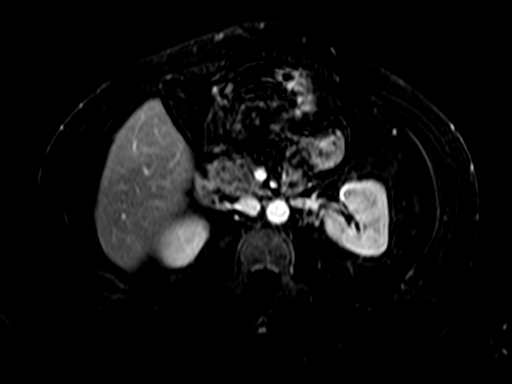
[im 37/56]
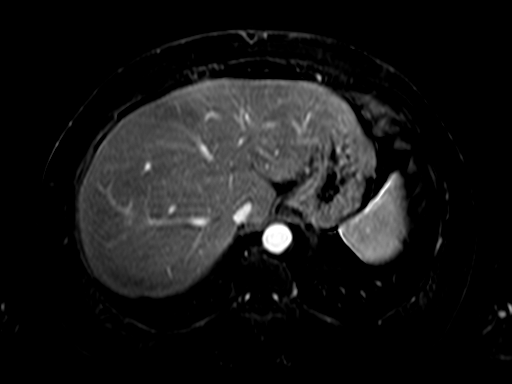
[im 56/56]
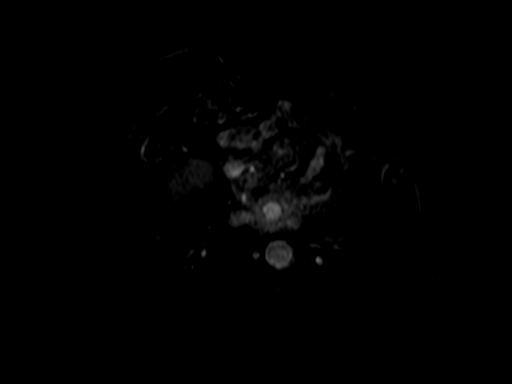

[Series 17: 90 sec sub · axial · 4.0mm · 0.74mm/px · z∈[-76,+144]mm · 4 of 56 slices shown]
[im 1/56]
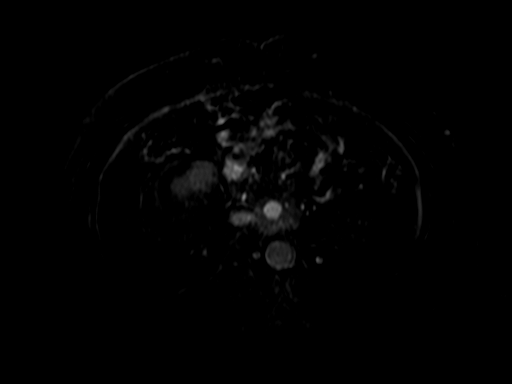
[im 19/56]
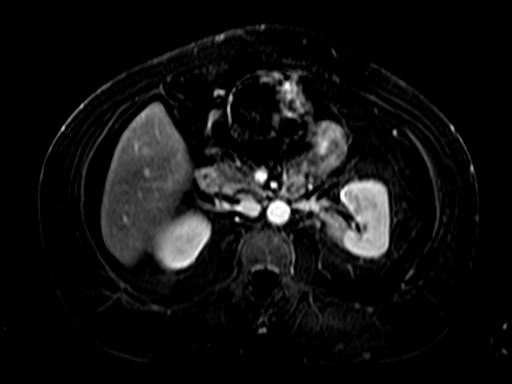
[im 37/56]
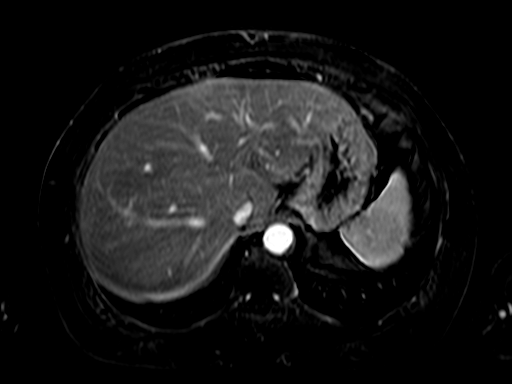
[im 56/56]
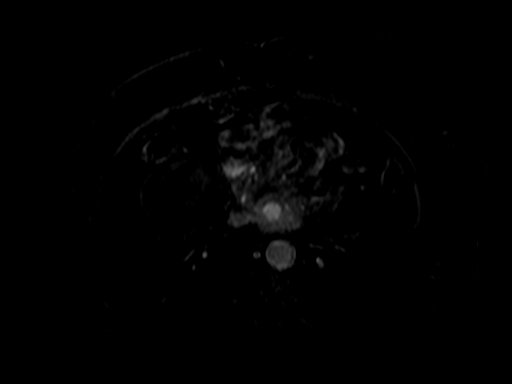

[Series 18: 3 min sub · axial · 4.0mm · 0.74mm/px · z∈[-76,+144]mm · 4 of 56 slices shown]
[im 1/56]
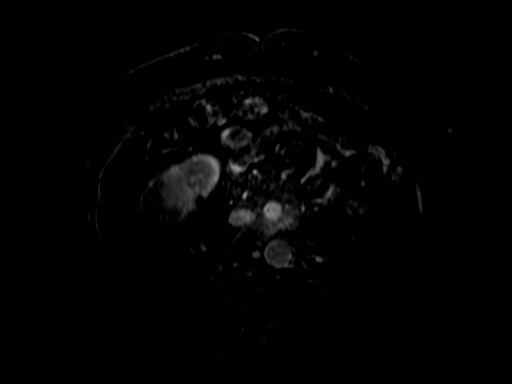
[im 19/56]
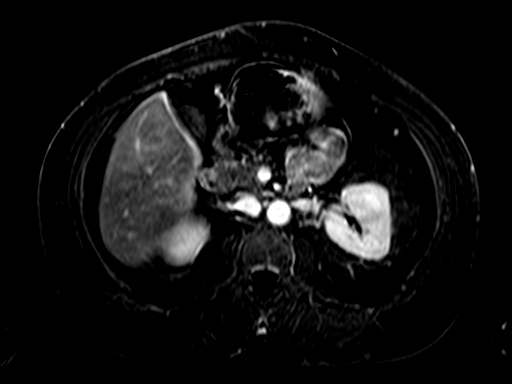
[im 37/56]
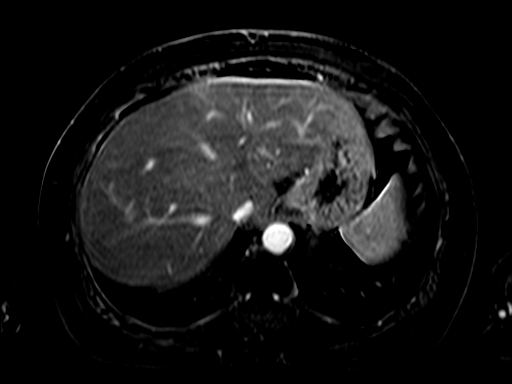
[im 56/56]
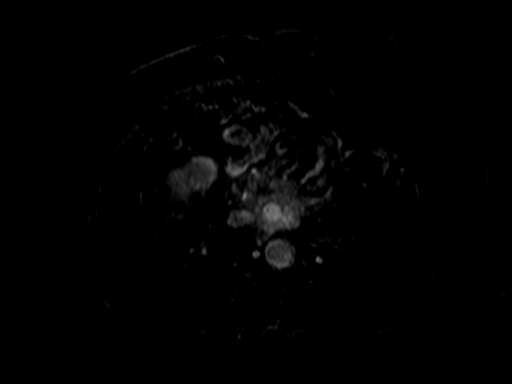

[Series 19: in phase- · axial · 7.0mm · 0.74mm/px · z∈[-75,+143]mm · 2 of 27 slices shown]
[im 1/27]
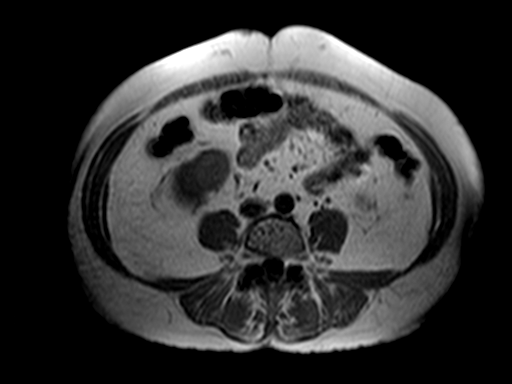
[im 27/27]
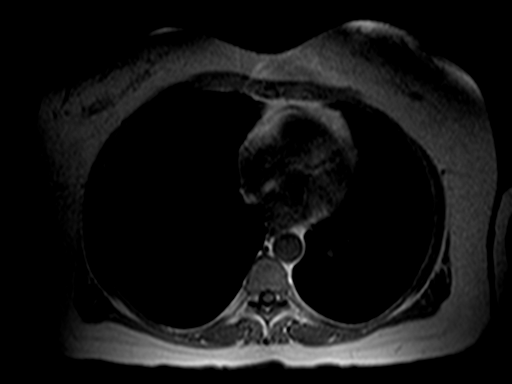

[27 of 48 positions shown; findings below may reference images not displayed]

FINDINGS: Lower chest:  Unremarkable

Hepatobiliary: The lesion of concern in the right hepatic lobe shown
on last year's CT scan measures 1.3 by 0.8 cm, minimally less than
on the prior CT, and is only well seen on the arterial phase images.
On the 3 minutes post-contrast sequence there is questionably a
lesion in this vicinity, but this structure does not have high T2
signal and is most conspicuous on the arterial phase images.

No other liver lesion is identified. Gallbladder and biliary system
unremarkable.

Pancreas: Unremarkable

Spleen: Unremarkable

Adrenals/Urinary Tract: Unremarkable

Stomach/Bowel: Unremarkable

Vascular/Lymphatic: Unremarkable

Other: No supplemental non-categorized findings.

Musculoskeletal: Unremarkable
IMPRESSION: 1. Small arterial phase enhancing lesion in the right hepatic lobe,
slightly smaller than on the prior CT scan, not readily apparent on
precontrast sequences. This is a typical appearance for focal
nodular hyperplasia. Less likely to be an atypical benign
hemangioma. The lesion is considered benign, and no further follow
up is required.
2. No cause for abdominal distention or abdominal tenderness is
identified.
